# Patient Record
Sex: Male | Born: 1960 | Race: White | Hispanic: No | Marital: Married | Smoking: Former smoker
Health system: Southern US, Community
[De-identification: ages and names within clinical notes are randomized; demographics above are authoritative.]

## PROBLEM LIST (undated history)

## (undated) DIAGNOSIS — M1990 Unspecified osteoarthritis, unspecified site: Secondary | ICD-10-CM

## (undated) DIAGNOSIS — R7303 Prediabetes: Secondary | ICD-10-CM

## (undated) DIAGNOSIS — I10 Essential (primary) hypertension: Secondary | ICD-10-CM

## (undated) DIAGNOSIS — Z87442 Personal history of urinary calculi: Secondary | ICD-10-CM

## (undated) DIAGNOSIS — N200 Calculus of kidney: Secondary | ICD-10-CM

## (undated) HISTORY — PX: LITHOTRIPSY: SUR834

## (undated) HISTORY — PX: HERNIA REPAIR: SHX51

---

## 2011-04-24 ENCOUNTER — Emergency Department (HOSPITAL_BASED_OUTPATIENT_CLINIC_OR_DEPARTMENT_OTHER)
Admission: EM | Admit: 2011-04-24 | Discharge: 2011-04-24 | Disposition: A | Discharge status: Home / Self Care | Payer: 59 | Attending: Emergency Medicine | Admitting: Emergency Medicine

## 2011-04-24 ENCOUNTER — Emergency Department (INDEPENDENT_AMBULATORY_CARE_PROVIDER_SITE_OTHER): Payer: 59

## 2011-04-24 ENCOUNTER — Encounter (HOSPITAL_BASED_OUTPATIENT_CLINIC_OR_DEPARTMENT_OTHER): Payer: Self-pay | Admitting: *Deleted

## 2011-04-24 DIAGNOSIS — R109 Unspecified abdominal pain: Secondary | ICD-10-CM

## 2011-04-24 DIAGNOSIS — R319 Hematuria, unspecified: Secondary | ICD-10-CM

## 2011-04-24 DIAGNOSIS — I10 Essential (primary) hypertension: Secondary | ICD-10-CM | POA: Insufficient documentation

## 2011-04-24 DIAGNOSIS — R911 Solitary pulmonary nodule: Secondary | ICD-10-CM

## 2011-04-24 DIAGNOSIS — N200 Calculus of kidney: Secondary | ICD-10-CM

## 2011-04-24 DIAGNOSIS — K8020 Calculus of gallbladder without cholecystitis without obstruction: Secondary | ICD-10-CM | POA: Insufficient documentation

## 2011-04-24 DIAGNOSIS — N201 Calculus of ureter: Secondary | ICD-10-CM

## 2011-04-24 HISTORY — DX: Essential (primary) hypertension: I10

## 2011-04-24 HISTORY — DX: Calculus of kidney: N20.0

## 2011-04-24 LAB — URINE MICROSCOPIC-ADD ON

## 2011-04-24 LAB — CBC
HCT: 40.9 % (ref 39.0–52.0)
Hemoglobin: 14.8 g/dL (ref 13.0–17.0)
RBC: 4.91 MIL/uL (ref 4.22–5.81)
WBC: 16.9 10*3/uL — ABNORMAL HIGH (ref 4.0–10.5)

## 2011-04-24 LAB — DIFFERENTIAL
Basophils Relative: 0 % (ref 0–1)
Lymphocytes Relative: 6 % — ABNORMAL LOW (ref 12–46)
Monocytes Absolute: 0.8 10*3/uL (ref 0.1–1.0)
Monocytes Relative: 5 % (ref 3–12)
Neutro Abs: 15.1 10*3/uL — ABNORMAL HIGH (ref 1.7–7.7)
Neutrophils Relative %: 90 % — ABNORMAL HIGH (ref 43–77)

## 2011-04-24 LAB — URINALYSIS, ROUTINE W REFLEX MICROSCOPIC
Glucose, UA: 500 mg/dL — AB
Leukocytes, UA: NEGATIVE
Nitrite: NEGATIVE
Specific Gravity, Urine: 1.027 (ref 1.005–1.030)
pH: 6 (ref 5.0–8.0)

## 2011-04-24 LAB — BASIC METABOLIC PANEL
BUN: 24 mg/dL — ABNORMAL HIGH (ref 6–23)
CO2: 26 mEq/L (ref 19–32)
Chloride: 98 mEq/L (ref 96–112)
Creatinine, Ser: 1.2 mg/dL (ref 0.50–1.35)
GFR calc Af Amer: 80 mL/min — ABNORMAL LOW (ref >90)
Potassium: 5 mEq/L (ref 3.5–5.1)

## 2011-04-24 MED ORDER — HYDROMORPHONE HCL PF 1 MG/ML IJ SOLN
1.0000 mg | Freq: Once | INTRAMUSCULAR | Status: AC
  Administered 2011-04-24: 1 mg via INTRAVENOUS
  Filled 2011-04-24: qty 1

## 2011-04-24 MED ORDER — CIPROFLOXACIN IN D5W 400 MG/200ML IV SOLN
400.0000 mg | Freq: Once | INTRAVENOUS | Status: AC
  Administered 2011-04-24: 400 mg via INTRAVENOUS
  Filled 2011-04-24: qty 200

## 2011-04-24 MED ORDER — CIPROFLOXACIN HCL 500 MG PO TABS: 500.0000 mg | ORAL_TABLET | Freq: Two times a day (BID) | ORAL | Status: AC

## 2011-04-24 MED ORDER — SODIUM CHLORIDE 0.9 % IV BOLUS (SEPSIS)
1000.0000 mL | Freq: Once | INTRAVENOUS | Status: AC
  Administered 2011-04-24: 1000 mL via INTRAVENOUS

## 2011-04-24 MED ORDER — TAMSULOSIN HCL 0.4 MG PO CAPS: 0.4000 mg | ORAL_CAPSULE | Freq: Every day | ORAL | Status: AC

## 2011-04-24 MED ORDER — ONDANSETRON HCL 4 MG/2ML IJ SOLN
4.0000 mg | Freq: Once | INTRAMUSCULAR | Status: AC
  Administered 2011-04-24: 4 mg via INTRAVENOUS
  Filled 2011-04-24: qty 2

## 2011-04-24 MED ORDER — KETOROLAC TROMETHAMINE 30 MG/ML IJ SOLN
30.0000 mg | Freq: Once | INTRAMUSCULAR | Status: AC
  Administered 2011-04-24: 30 mg via INTRAVENOUS
  Filled 2011-04-24: qty 1

## 2011-04-24 MED ORDER — HYDROCODONE-ACETAMINOPHEN 5-325 MG PO TABS: 1.0000 | ORAL_TABLET | Freq: Four times a day (QID) | ORAL | Status: AC | PRN

## 2011-04-24 NOTE — ED Provider Notes (Signed)
History     CSN: [id number]  Arrival date & time 04/24/11  1247   First MD Initiated Contact with Patient 04/24/11 1408      Chief Complaint  Patient presents with  . Flank Pain    (Consider location/radiation/quality/duration/timing/severity/associated sxs/prior treatment) HPI This male with history of multiple kidney stones presents with the acute onset of left sided flank pain.  The pain awoke him from sleeping approximately 12 hours ago.  Since onset the pain has been persistent.  The pain is sharp, crampy with radiation towards his left inguinal region.  The patient also complains of nausea, vomiting.  No significant relief with Tylenol.   Past Medical History  Diagnosis Date  . Kidney stones   . Hypertension     Past Surgical History  Procedure Date  . Hernia repair   . Lithotripsy     No family history on file.  History  Substance Use Topics  . Smoking status: Former Games developer  . Smokeless tobacco: Never Used  . Alcohol Use: No      Review of Systems  Constitutional:       Per HPI, otherwise negative  HENT:       Per HPI, otherwise negative  Eyes: Negative.   Respiratory:       Per HPI, otherwise negative  Cardiovascular:       Per HPI, otherwise negative  Gastrointestinal: Positive for nausea and vomiting.  Genitourinary: Positive for flank pain.  Musculoskeletal:       Per HPI, otherwise negative  Skin: Negative.   Neurological: Negative for syncope.    Allergies  Review of patient's allergies indicates no known allergies.  Home Medications   Current Outpatient Rx  Name Route Sig Dispense Refill  . ASPIRIN 81 MG PO TABS Oral Take 81 mg by mouth daily.    . OMEGA-3 FATTY ACIDS 1000 MG PO CAPS Oral Take 2 g by mouth daily.    . OXYCODONE HCL PO Oral Take by mouth. Old rx from 2011    . QUINAPRIL HCL 40 MG PO TABS Oral Take 40 mg by mouth at bedtime.    . TRAMADOL HCL 50 MG PO TABS Oral Take 50 mg by mouth every 6 (six) hours as needed.       BP 144/90  Pulse 88  Temp(Src) 98.3 F (36.8 C) (Oral)  Resp 18  Ht 6' (1.829 m)  Wt 235 lb (106.595 kg)  BMI 31.87 kg/m2  SpO2 98%  Physical Exam  Nursing note and vitals reviewed. Constitutional: He is oriented to person, place, and time. He appears well-developed. No distress.  HENT:  Head: Normocephalic and atraumatic.  Eyes: Conjunctivae and EOM are normal.  Cardiovascular: Normal rate and regular rhythm.   Pulmonary/Chest: Effort normal. No stridor. No respiratory distress.  Abdominal: He exhibits no distension. There is tenderness. There is CVA tenderness. There is no rigidity, no rebound and no guarding.  Musculoskeletal: He exhibits no edema.  Neurological: He is alert and oriented to person, place, and time.  Skin: Skin is warm and dry.  Psychiatric: He has a normal mood and affect.    ED Course  Procedures (including critical care time)  Labs Reviewed  URINALYSIS, ROUTINE W REFLEX MICROSCOPIC - Abnormal; Notable for the following:    APPearance CLOUDY (*)    Glucose, UA 500 (*)    Hgb urine dipstick LARGE (*)    Ketones, ur 15 (*)    Protein, ur 30 (*)    All  other components within normal limits  CBC - Abnormal; Notable for the following:    WBC 16.9 (*)    MCHC 36.2 (*)    All other components within normal limits  DIFFERENTIAL - Abnormal; Notable for the following:    Neutrophils Relative 90 (*)    Neutro Abs 15.1 (*)    Lymphocytes Relative 6 (*)    All other components within normal limits  BASIC METABOLIC PANEL - Abnormal; Notable for the following:    Sodium 134 (*)    Glucose, Bld 143 (*)    BUN 24 (*)    GFR calc non Af Amer 69 (*)    GFR calc Af Amer 80 (*)    All other components within normal limits  URINE MICROSCOPIC-ADD ON - Abnormal; Notable for the following:    Bacteria, UA MANY (*)    All other components within normal limits   Ct Abdomen Pelvis Wo Contrast  04/24/2011  *RADIOLOGY REPORT*  Clinical Data: Left flank pain and  hematuria.  CT ABDOMEN AND PELVIS WITHOUT CONTRAST  Technique:  Multidetector CT imaging of the abdomen and pelvis was performed following the standard protocol without intravenous contrast.  Comparison: Abdominal radiograph 04/24/2011.  Findings: Lung bases show minimal dependent atelectasis.  A 3 mm subpleural left lower lobe nodule is nonspecific.  Heart size normal.  No pericardial or pleural effusion.  Liver is unremarkable.  A stone is seen in the gallbladder. Adrenal glands are unremarkable.  A punctate stone is seen in the interpolar right kidney.  There is extensive left perinephric edema with minimal left hydronephrosis, secondary to a 5 mm mid left ureteral stone. Remainder of the left ureter is decompressed.  Spleen, pancreas, stomach and bowel are unremarkable.  No pathologically enlarged lymph nodes.  No free fluid.  Circumaortic left renal vein is incidentally noted.  No worrisome lytic or sclerotic lesions.  Degenerative changes are seen in the spine.  IMPRESSION:  1.  Mildly obstructing mid left ureteral stone, as on comparison radiographs. 2.  Tiny subpleural left lower lobe nodule is nonspecific. If the patient is at high risk for bronchogenic carcinoma, follow-up chest CT at 1 year is recommended.  If the patient is at low risk, no follow-up is needed.  This recommendation follows the consensus statement: Guidelines for Management of Small Pulmonary Nodules Detected on CT Scans:  A Statement from the Fleischner Society as published in Radiology 2005; 237:395-400. 3.  Cholelithiasis. 4.  Punctate right renal stone.  Original Report Authenticated By: Reyes Ivan, M.D.   Dg Abd 1 View  04/24/2011  *RADIOLOGY REPORT*  Clinical Data: Left flank pain and hematuria.  ABDOMEN - 1 VIEW  Comparison: None.  Findings: A 5 mm calcific density projects over the left L3 transverse process.  No additional radiopaque calculi project over the expected course of the ureters.  The right renal outline is  obscured by stool in the colon.  IMPRESSION:  1.  Possible mid left ureteral stone. 2.  Suspect constipation.  Original Report Authenticated By: Reyes Ivan, M.D.   XR reviewed by me  No diagnosis found.    MDM  This 51 year old male presents with left flank pain.  On exam the patient is uncomfortable appearing.  Following IV fluids, analgesics the patient is significantly improved.  The patient's labs and radiographic studies are consistent with kidney stone, with some concern for infection.  There is no evidence of significant obstruction.  The patient was discharged in stable condition  home with the urologist follow-up.   Gerhard Munch, MD 04/24/11 [phone]

## 2011-04-24 NOTE — ED Notes (Signed)
C/o left side flank pain since 0115 this morning- hx of kidney stones

## 2013-04-07 IMAGING — CR DG ABDOMEN 1V
2 series · 2 of 2 positions shown · non-contrast
Comparison: None.

CLINICAL DATA: Left flank pain and hematuria.

ABDOMEN - 1 VIEW

[t abdomen supine (1 of 2)]
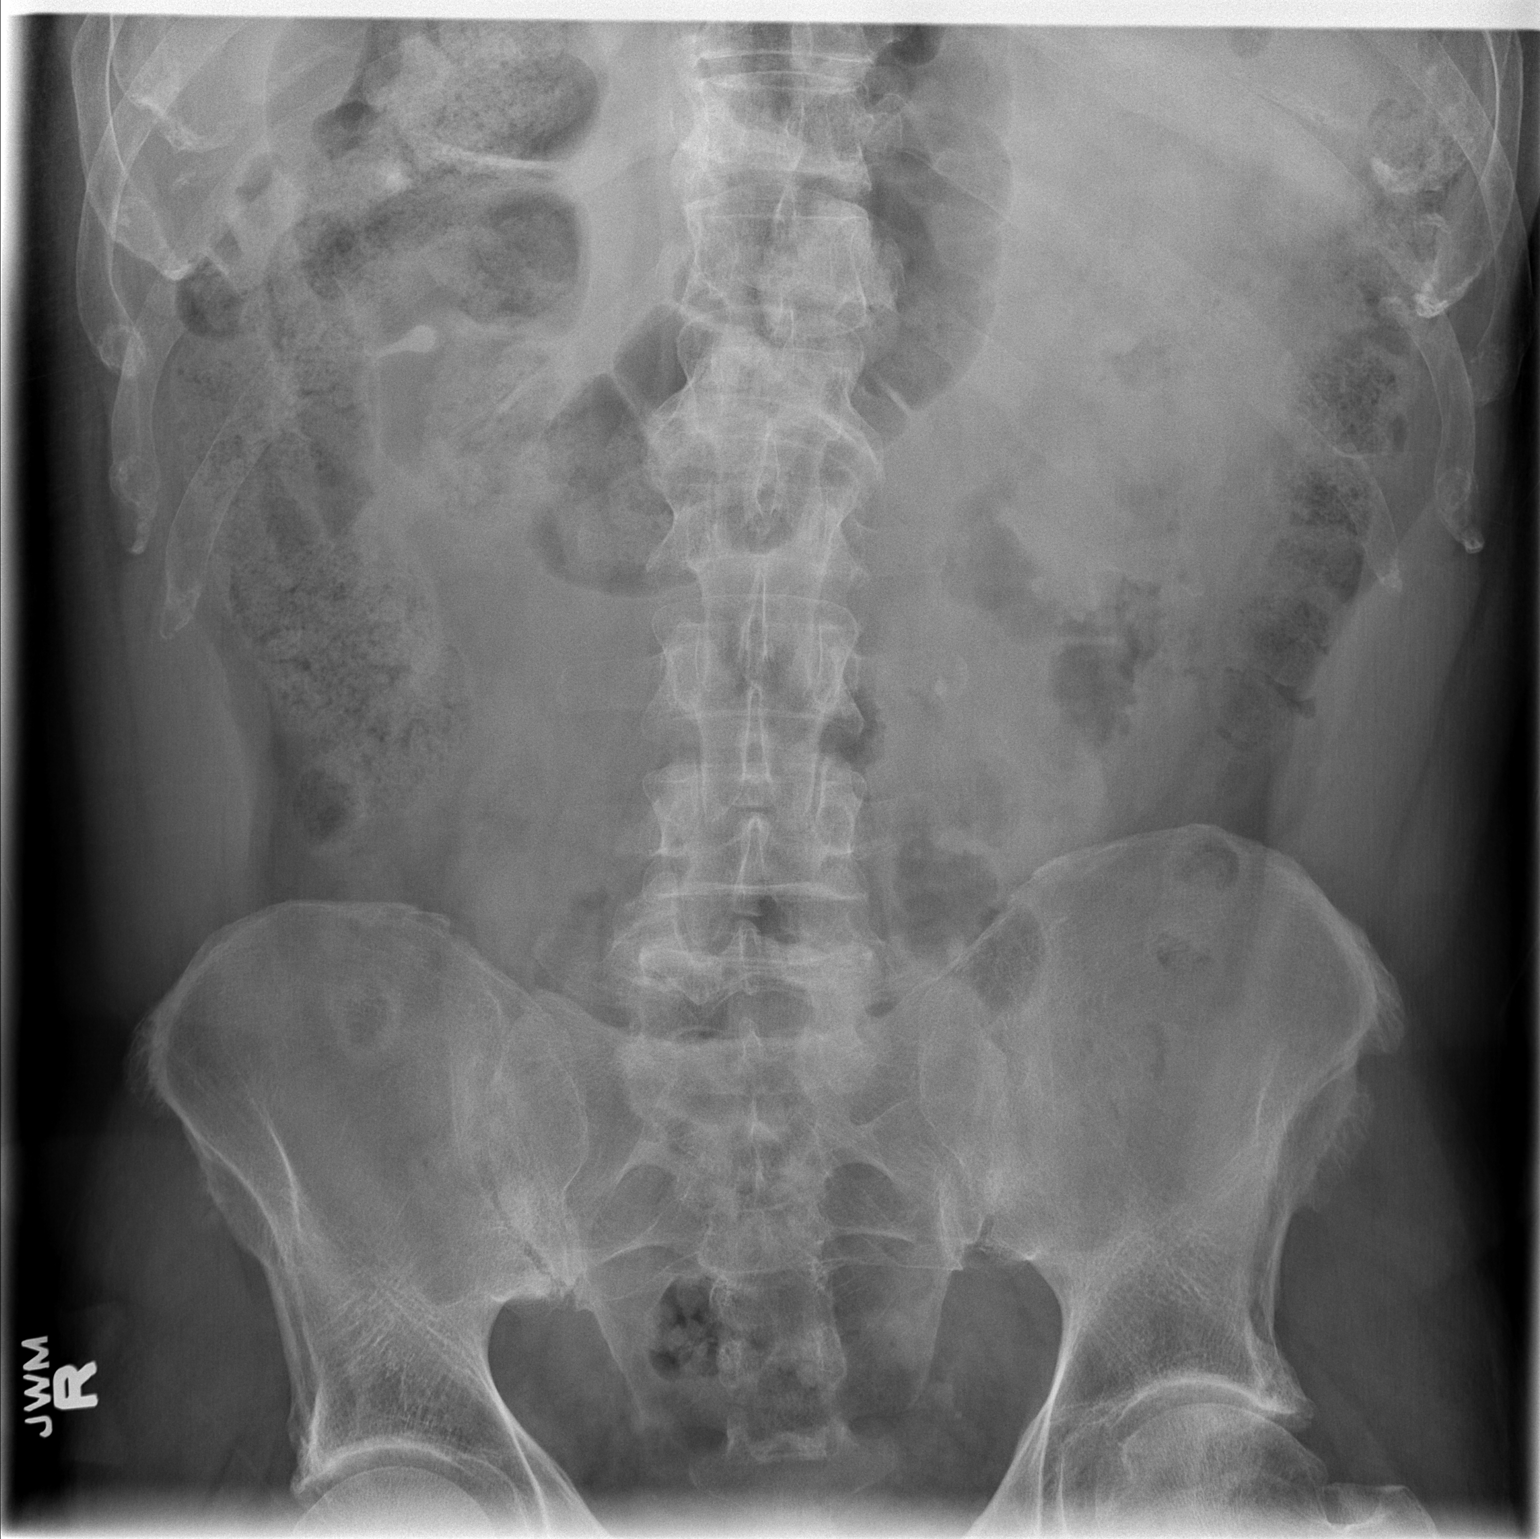

[t abdomen supine (2 of 2)]
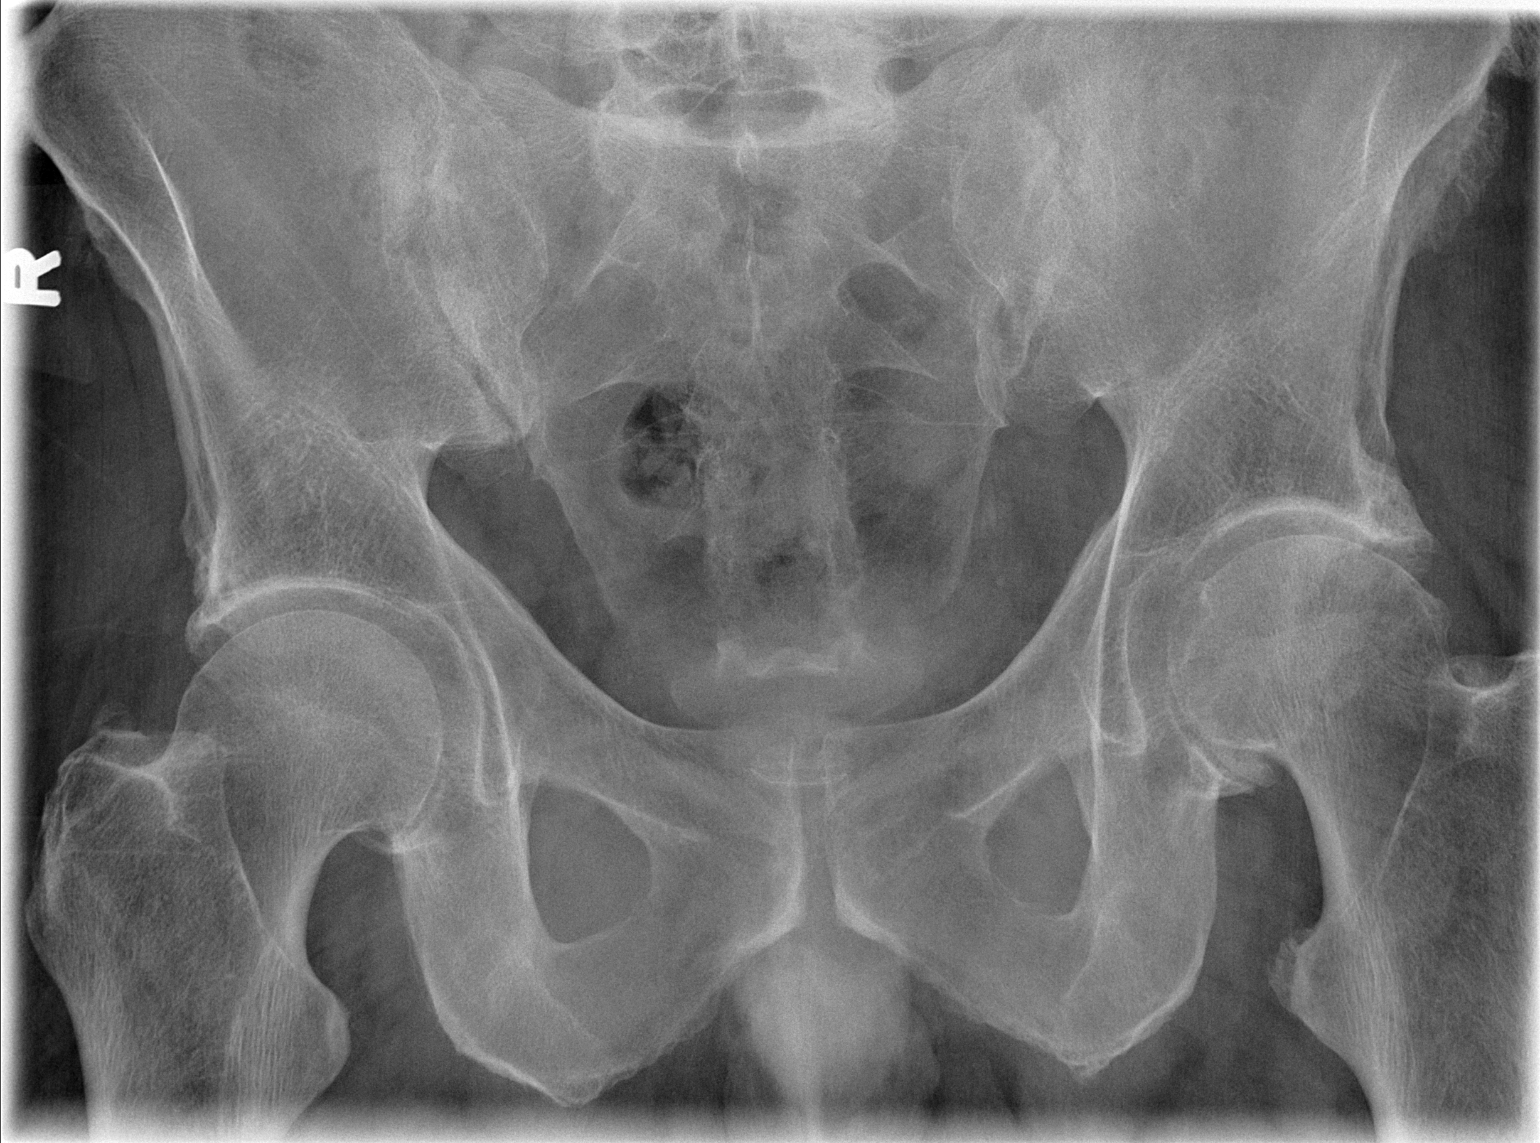

[2 of 2 positions shown; findings below may reference images not displayed]

FINDINGS: A 5 mm calcific density projects over the left L3
transverse process.  No additional radiopaque calculi project over
the expected course of the ureters.  The right renal outline is
obscured by stool in the colon.
IMPRESSION: 1.  Possible mid left ureteral stone.
2.  Suspect constipation.

## 2013-04-07 IMAGING — CT CT ABD-PELV W/O CM
2 of 4 series · 16 of 46 positions shown, 18 images · non-contrast
Comparison: Abdominal radiograph 04/24/2011.

CLINICAL DATA: Left flank pain and hematuria.

CT ABDOMEN AND PELVIS WITHOUT CONTRAST
TECHNIQUE: Multidetector CT imaging of the abdomen and pelvis was
performed following the standard protocol without intravenous
contrast.

[Series 2: renal stone > 200 lbs 5.0 b31f · axial · 0.79mm/px · z∈[+949,+1434]mm · 13 of 107 slices shown, 15 images]
[im 5/107  soft-tissue]
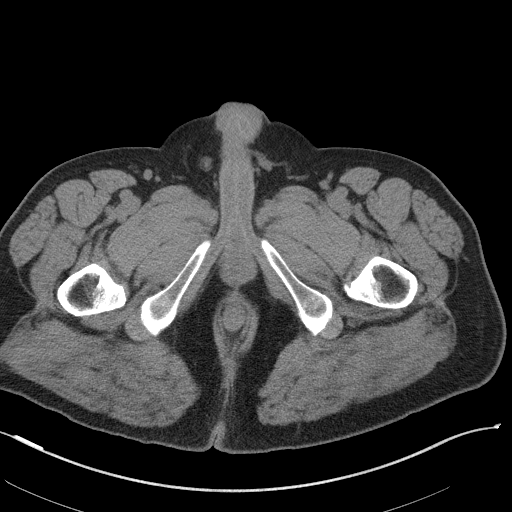
[im 5/107  bone]
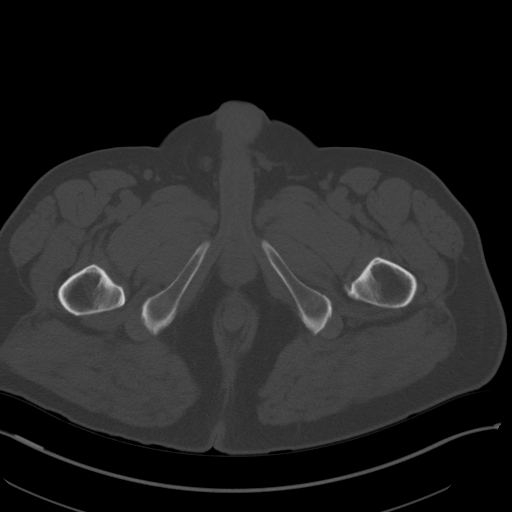
[im 14/107  soft-tissue]
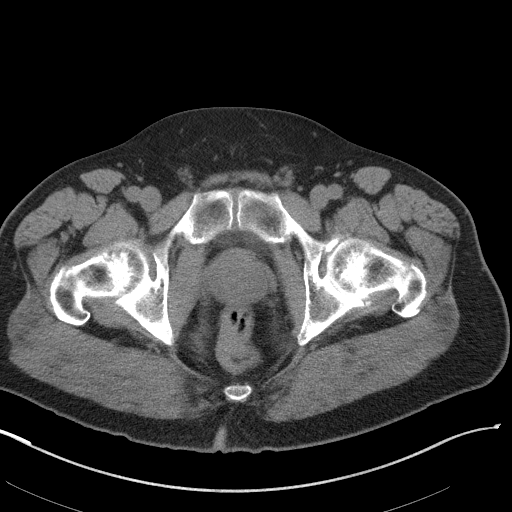
[im 24/107  soft-tissue]
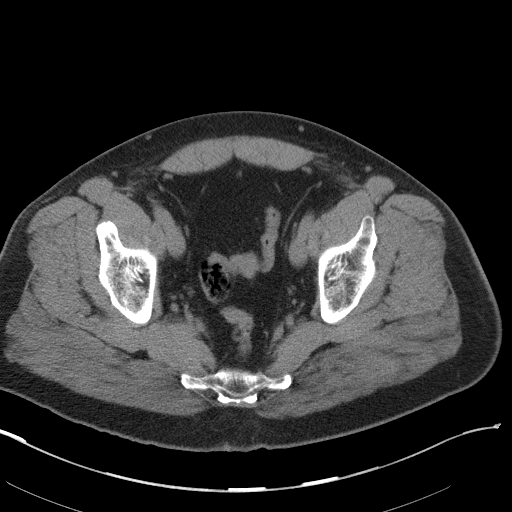
[im 28/107  soft-tissue]
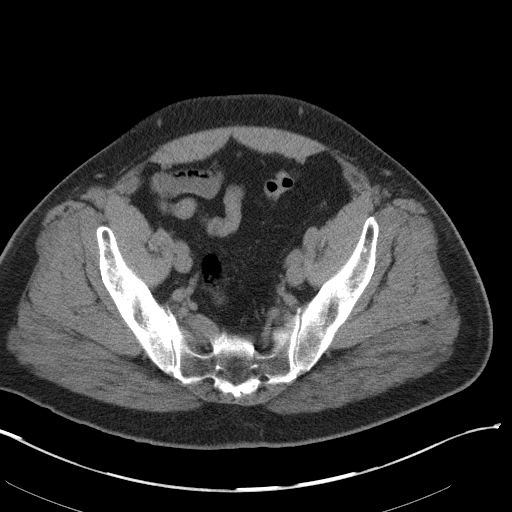
[im 37/107  soft-tissue]
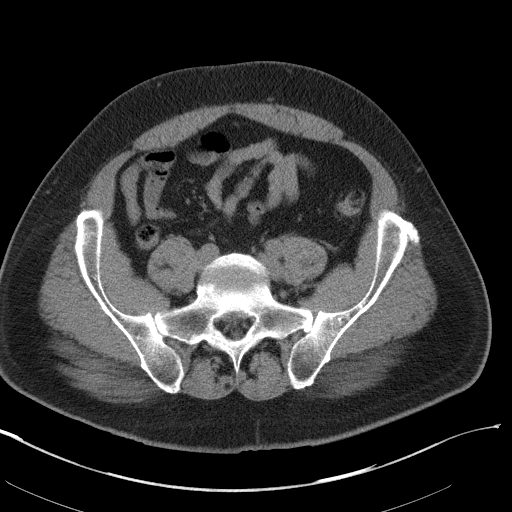
[im 47/107  soft-tissue]
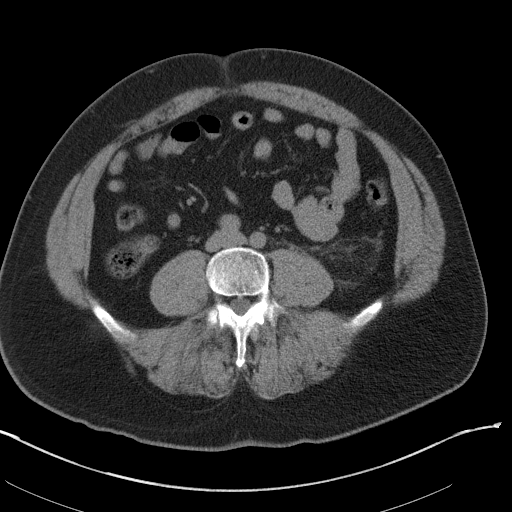
[im 56/107  soft-tissue]
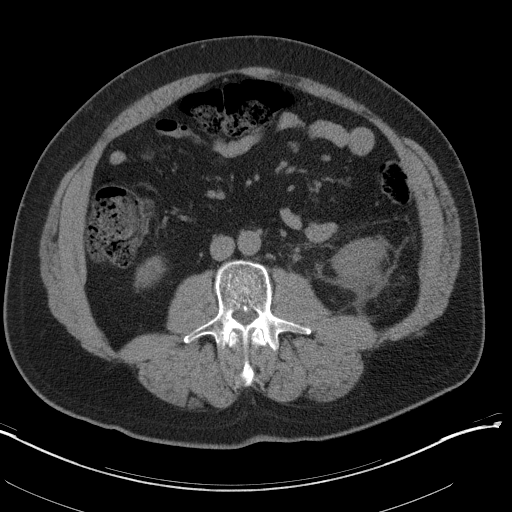
[im 60/107  soft-tissue]
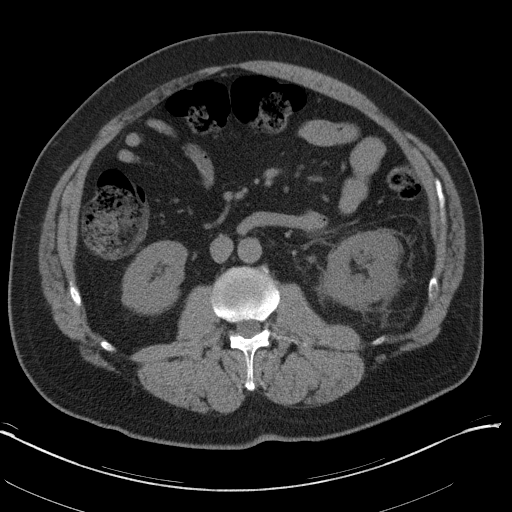
[im 70/107  soft-tissue]
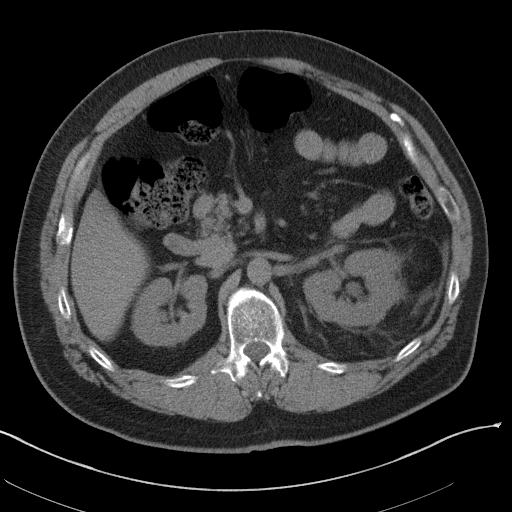
[im 70/107  bone]
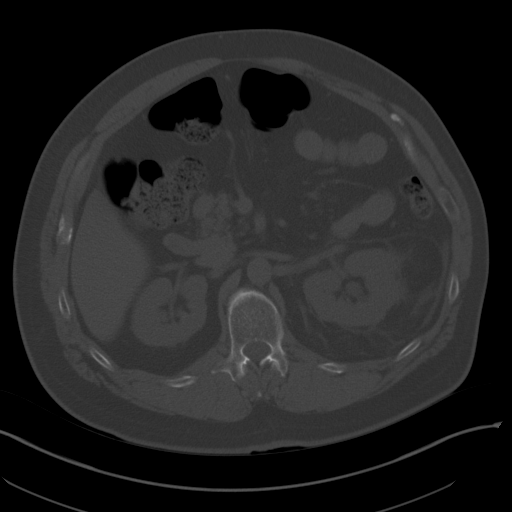
[im 79/107  soft-tissue]
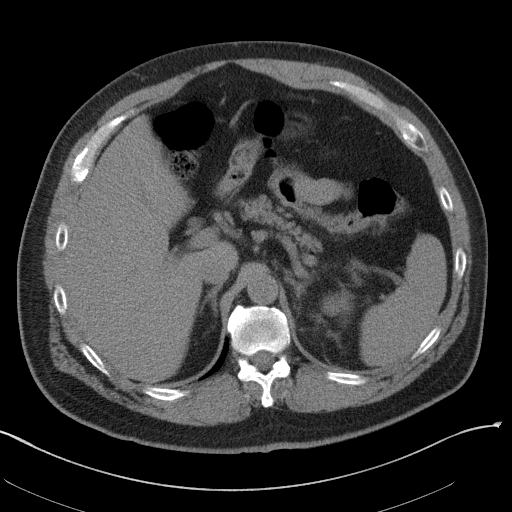
[im 83/107  soft-tissue]
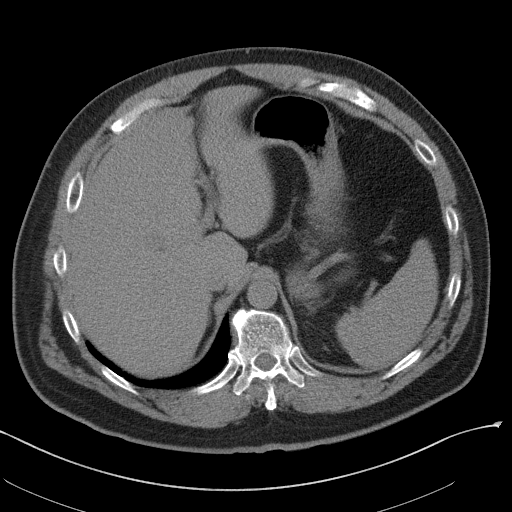
[im 93/107  soft-tissue]
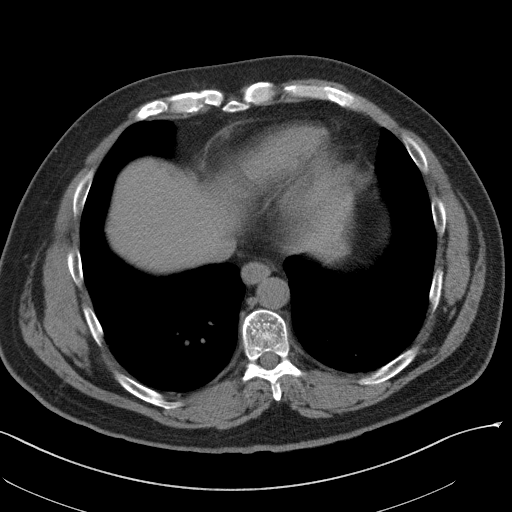
[im 102/107  soft-tissue]
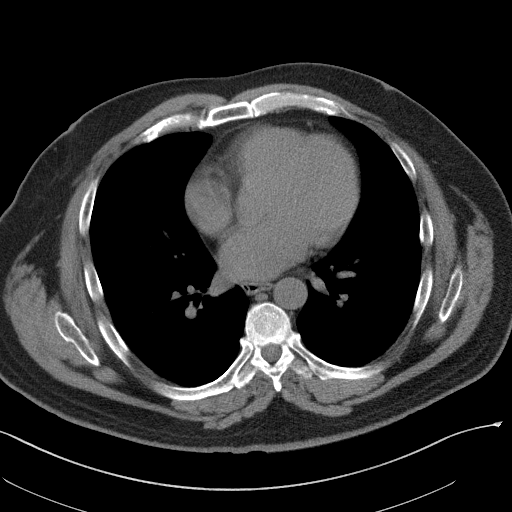

[Series 5: renal stone 3.0 coronal · coronal · 0.84mm/px · 3 of 111 slices shown]
[im 37/111  soft-tissue]
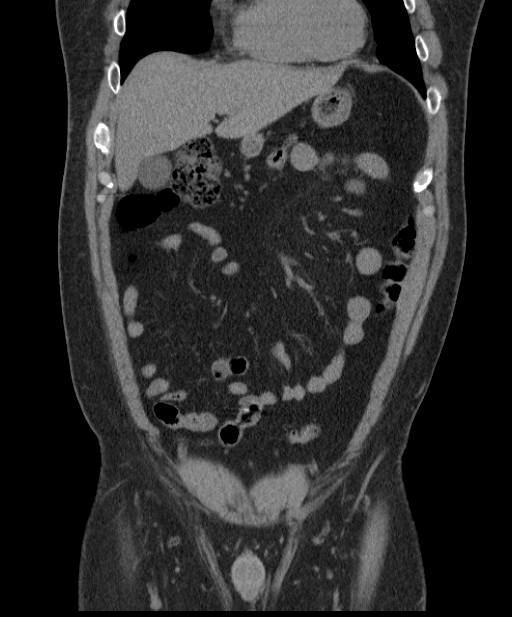
[im 49/111  soft-tissue]
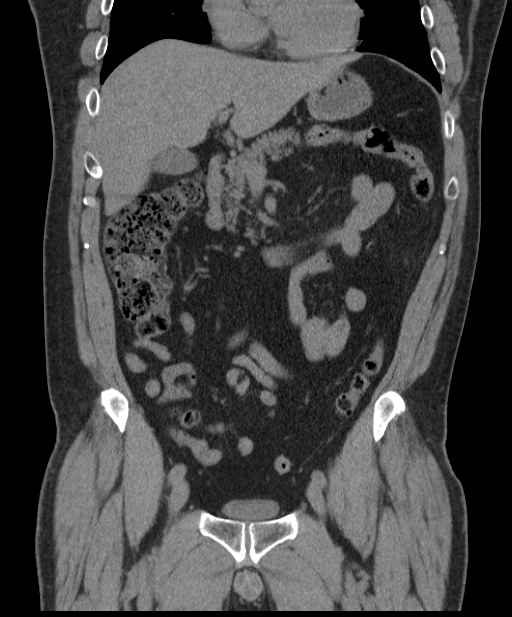
[im 62/111  soft-tissue]
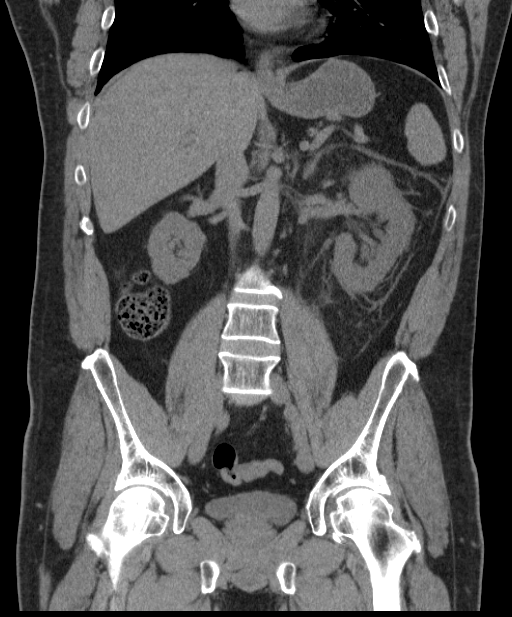

[16 of 46 positions shown; findings below may reference images not displayed]

FINDINGS: Lung bases show minimal dependent atelectasis.  A 3 mm
subpleural left lower lobe nodule is nonspecific.  Heart size
normal.  No pericardial or pleural effusion.

Liver is unremarkable.  A stone is seen in the gallbladder.
Adrenal glands are unremarkable.  A punctate stone is seen in the
interpolar right kidney.

There is extensive left perinephric edema with minimal left
hydronephrosis, secondary to a 5 mm mid left ureteral stone.
Remainder of the left ureter is decompressed.

Spleen, pancreas, stomach and bowel are unremarkable.  No
pathologically enlarged lymph nodes.  No free fluid.  Circumaortic
left renal vein is incidentally noted.  No worrisome lytic or
sclerotic lesions.  Degenerative changes are seen in the spine.
IMPRESSION: 1.  Mildly obstructing mid left ureteral stone, as on comparison
radiographs.
2.  Tiny subpleural left lower lobe nodule is nonspecific. If the
patient is at high risk for bronchogenic carcinoma, follow-up chest
CT at 1 year is recommended.  If the patient is at low risk, no
follow-up is needed.  This recommendation follows the consensus
statement: Guidelines for Management of Small Pulmonary Nodules
Detected on CT Scans:  A Statement from the [HOSPITAL] as
published in Radiology 6660; [DATE].
3.  Cholelithiasis.
4.  Punctate right renal stone.

## 2017-03-18 ENCOUNTER — Other Ambulatory Visit: Payer: Self-pay | Admitting: Urology

## 2017-03-19 ENCOUNTER — Encounter (HOSPITAL_COMMUNITY): Payer: Self-pay | Admitting: General Practice

## 2017-03-20 NOTE — Progress Notes (Signed)
Please sign lithotripsy orders; Pt is scheduled for procedure on Monday 03/23/2017. Thanks.

## 2017-03-23 ENCOUNTER — Encounter (HOSPITAL_COMMUNITY)
Admission: RE | Disposition: A | Discharge status: Home / Self Care | Payer: Self-pay | Source: Ambulatory Visit | Attending: Urology

## 2017-03-23 ENCOUNTER — Ambulatory Visit (HOSPITAL_COMMUNITY)
Admission: RE | Admit: 2017-03-23 | Discharge: 2017-03-23 | Disposition: A | Discharge status: Home / Self Care | Payer: BLUE CROSS/BLUE SHIELD | Source: Ambulatory Visit | Attending: Urology | Admitting: Urology

## 2017-03-23 ENCOUNTER — Encounter (HOSPITAL_COMMUNITY): Payer: Self-pay | Admitting: *Deleted

## 2017-03-23 ENCOUNTER — Ambulatory Visit (HOSPITAL_COMMUNITY): Payer: BLUE CROSS/BLUE SHIELD

## 2017-03-23 ENCOUNTER — Other Ambulatory Visit: Payer: Self-pay

## 2017-03-23 DIAGNOSIS — Z87891 Personal history of nicotine dependence: Secondary | ICD-10-CM | POA: Insufficient documentation

## 2017-03-23 DIAGNOSIS — I10 Essential (primary) hypertension: Secondary | ICD-10-CM | POA: Diagnosis not present

## 2017-03-23 DIAGNOSIS — N201 Calculus of ureter: Secondary | ICD-10-CM | POA: Diagnosis not present

## 2017-03-23 HISTORY — PX: EXTRACORPOREAL SHOCK WAVE LITHOTRIPSY: SHX1557

## 2017-03-23 HISTORY — DX: Personal history of urinary calculi: Z87.442

## 2017-03-23 SURGERY — LITHOTRIPSY, ESWL
Anesthesia: LOCAL | Laterality: Right

## 2017-03-23 MED ORDER — SODIUM CHLORIDE 0.9 % IV SOLN
INTRAVENOUS | Status: DC
  Administered 2017-03-23: 10:00:00 via INTRAVENOUS

## 2017-03-23 MED ORDER — CIPROFLOXACIN HCL 500 MG PO TABS
500.0000 mg | ORAL_TABLET | ORAL | Status: AC
  Administered 2017-03-23: 500 mg via ORAL
  Filled 2017-03-23: qty 1

## 2017-03-23 MED ORDER — DIPHENHYDRAMINE HCL 25 MG PO CAPS
25.0000 mg | ORAL_CAPSULE | ORAL | Status: AC
  Administered 2017-03-23: 25 mg via ORAL
  Filled 2017-03-23: qty 1

## 2017-03-23 MED ORDER — DIAZEPAM 5 MG PO TABS
10.0000 mg | ORAL_TABLET | ORAL | Status: AC
  Administered 2017-03-23: 10 mg via ORAL
  Filled 2017-03-23: qty 2

## 2017-03-23 SURGICAL SUPPLY — 2 items
COVER SURGICAL LIGHT HANDLE (MISCELLANEOUS) ×2 IMPLANT
TOWEL OR 17X26 10 PK STRL BLUE (TOWEL DISPOSABLE) ×2 IMPLANT

## 2017-03-23 NOTE — Op Note (Signed)
See [facility] OP note scanned into chart. 

## 2017-03-23 NOTE — H&P (Signed)
H&P  Chief Complaint: Right sided kidney stone  History of Present Illness: 57 year old male presents for ESL. He has an intermittently symptomatic, non progressing right distal ureteral stone. We have explained treatment options with him--continued MET vs ureteroscopy vs ESL. He has chosen the latter. He is aware of risks and complications and desires to proceed.  Past Medical History:  Diagnosis Date  . History of kidney stones   . Hypertension   . Kidney stones     Past Surgical History:  Procedure Laterality Date  . HERNIA REPAIR     done as child  . LITHOTRIPSY      Home Medications:  Allergies as of 03/23/2017   No Known Allergies     Medication List    Notice   Cannot display discharge medications because the patient has not yet been admitted.     Allergies: No Known Allergies  History reviewed. No pertinent family history.  Social History:  reports that he has quit smoking. he has never used smokeless tobacco. He reports that he does not drink alcohol or use drugs.  ROS: A complete review of systems was performed.  All systems are negative except for pertinent findings as noted.  Physical Exam:  Vital signs in last 24 hours:   Constitutional:  Alert and oriented, No acute distress Cardiovascular: Regular rate and rhythm, No JVD Respiratory: Normal respiratory effort, Lungs clear bilaterally GI: Abdomen is soft, nontender, nondistended, no abdominal masses Lymphatic: No lymphadenopathy Neurologic: Grossly intact, no focal deficits Psychiatric: Normal mood and affect  Laboratory Data:  No results for input(s): WBC, HGB, HCT, PLT in the last 72 hours.  No results for input(s): NA, K, CL, GLUCOSE, BUN, CALCIUM, CREATININE in the last 72 hours.  Invalid input(s): CO3   No results found for this or any previous visit (from the past 24 hour(s)). No results found for this or any previous visit (from the past 240 hour(s)).  Renal Function: No results for  input(s): CREATININE in the last 168 hours. CrCl cannot be calculated (Patient's most recent lab result is older than the maximum 21 days allowed.).  Radiologic Imaging: No results found.  Impression/Assessment:  6 mm right distal ureteral stone  Plan:  ESL

## 2017-03-23 NOTE — Discharge Instructions (Signed)
Moderate Conscious Sedation, Adult, Care After °These instructions provide you with information about caring for yourself after your procedure. Your health care provider may also give you more specific instructions. Your treatment has been planned according to current medical practices, but problems sometimes occur. Call your health care provider if you have any problems or questions after your procedure. °What can I expect after the procedure? °After your procedure, it is common: °· To feel sleepy for several hours. °· To feel clumsy and have poor balance for several hours. °· To have poor judgment for several hours. °· To vomit if you eat too soon. ° °Follow these instructions at home: °For at least 24 hours after the procedure: ° °· Do not: °? Participate in activities where you could fall or become injured. °? Drive. °? Use heavy machinery. °? Drink alcohol. °? Take sleeping pills or medicines that cause drowsiness. °? Make important decisions or sign legal documents. °? Take care of children on your own. °· Rest. °Eating and drinking °· Follow the diet recommended by your health care provider. °· If you vomit: °? Drink water, juice, or soup when you can drink without vomiting. °? Make sure you have little or no nausea before eating solid foods. °General instructions °· Have a responsible adult stay with you until you are awake and alert. °· Take over-the-counter and prescription medicines only as told by your health care provider. °· If you smoke, do not smoke without supervision. °· Keep all follow-up visits as told by your health care provider. This is important. °Contact a health care provider if: °· You keep feeling nauseous or you keep vomiting. °· You feel light-headed. °· You develop a rash. °· You have a fever. °Get help right away if: °· You have trouble breathing. °This information is not intended to replace advice given to you by your health care provider. Make sure you discuss any questions you have  with your health care provider. °Document Released: 11/10/2012 Document Revised: 06/25/2015 Document Reviewed: 05/12/2015 °Elsevier Interactive Patient Education © 2018 [employer] °Lithotripsy, Care After °This sheet gives you information about how to care for yourself after your procedure. Your health care provider may also give you more specific instructions. If you have problems or questions, contact your health care provider. °What can I expect after the procedure? °After the procedure, it is common to have: °· Some blood in your urine. This should only last for a few days. °· Soreness in your back, sides, or upper abdomen for a few days. °· Blotches or bruises on your back where the pressure wave entered the skin. °· Pain, discomfort, or nausea when pieces (fragments) of the kidney stone move through the tube that carries urine from the kidney to the bladder (ureter). Stone fragments may pass soon after the procedure, but they may continue to pass for up to 4-8 weeks. °? If you have severe pain or nausea, contact your health care provider. This may be caused by a large stone that was not broken up, and this may mean that you need more treatment. °· Some pain or discomfort during urination. °· Some pain or discomfort in the lower abdomen or (in men) at the base of the penis. ° °Follow these instructions at home: °Medicines °· Take over-the-counter and prescription medicines only as told by your health care provider. °· If you were prescribed an antibiotic medicine, take it as told by your health care provider. Do not stop taking the antibiotic even if you   start to feel better. °· Do not drive for 24 hours if you were given a medicine to help you relax (sedative). °· Do not drive or use heavy machinery while taking prescription pain medicine. °Eating and drinking °· Drink enough water and fluids to keep your urine clear or pale yellow. This helps any remaining pieces of the stone to pass. It can also help  prevent new stones from forming. °· Eat plenty of fresh fruits and vegetables. °· Follow instructions from your health care provider about eating and drinking restrictions. You may be instructed: °? To reduce how much salt (sodium) you eat or drink. Check ingredients and nutrition facts on packaged foods and beverages. °? To reduce how much meat you eat. °· Eat the recommended amount of calcium for your age and gender. Ask your health care provider how much calcium you should have. °General instructions °· Get plenty of rest. °· Most people can resume normal activities 1-2 days after the procedure. Ask your health care provider what activities are safe for you. °· If directed, strain all urine through the strainer that was provided by your health care provider. °? Keep all fragments for your health care provider to see. Any stones that are found may be sent to a medical lab for examination. The stone may be as small as a grain of salt. °· Keep all follow-up visits as told by your health care provider. This is important. °Contact a health care provider if: °· You have pain that is severe or does not get better with medicine. °· You have nausea that is severe or does not go away. °· You have blood in your urine longer than your health care provider told you to expect. °· You have more blood in your urine. °· You have pain during urination that does not go away. °· You urinate more frequently than usual and this does not go away. °· You develop a rash or any other possible signs of an allergic reaction. °Get help right away if: °· You have severe pain in your back, sides, or upper abdomen. °· You have severe pain while urinating. °· Your urine is very dark red. °· You have blood in your stool (feces). °· You cannot pass any urine at all. °· You feel a strong urge to urinate after emptying your bladder. °· You have a fever or chills. °· You develop shortness of breath, difficulty breathing, or chest pain. °· You have  severe nausea that leads to persistent vomiting. °· You faint. °Summary °· After this procedure, it is common to have some pain, discomfort, or nausea when pieces (fragments) of the kidney stone move through the tube that carries urine from the kidney to the bladder (ureter). If this pain or nausea is severe, however, you should contact your health care provider. °· Most people can resume normal activities 1-2 days after the procedure. Ask your health care provider what activities are safe for you. °· Drink enough water and fluids to keep your urine clear or pale yellow. This helps any remaining pieces of the stone to pass, and it can help prevent new stones from forming. °· If directed, strain your urine and keep all fragments for your health care provider to see. Fragments or stones may be as small as a grain of salt. °· Get help right away if you have severe pain in your back, sides, or upper abdomen or have severe pain while urinating. °This information is not intended to replace advice   given to you by your health care provider. Make sure you discuss any questions you have with your health care provider. °Document Released: 02/09/2007 Document Revised: 12/12/2015 Document Reviewed: 12/12/2015 °Elsevier Interactive Patient Education © 2018 Elsevier Inc. ° °See [facility] discharge instructions in chart. °

## 2017-03-24 ENCOUNTER — Encounter (HOSPITAL_COMMUNITY): Payer: Self-pay | Admitting: Urology

## 2019-05-25 ENCOUNTER — Ambulatory Visit: Payer: BLUE CROSS/BLUE SHIELD | Admitting: Orthopaedic Surgery

## 2019-05-25 ENCOUNTER — Ambulatory Visit: Payer: Self-pay

## 2019-05-25 ENCOUNTER — Other Ambulatory Visit: Payer: Self-pay

## 2019-05-25 DIAGNOSIS — M25552 Pain in left hip: Secondary | ICD-10-CM

## 2019-05-25 DIAGNOSIS — M1612 Unilateral primary osteoarthritis, left hip: Secondary | ICD-10-CM | POA: Insufficient documentation

## 2019-05-25 DIAGNOSIS — M25559 Pain in unspecified hip: Secondary | ICD-10-CM

## 2019-05-25 NOTE — Progress Notes (Signed)
Office Visit Note   Patient: Gregory Howard           Date of Birth: 11-Jun-1960           MRN: [id number] Visit Date: 05/25/2019              Requested by: Lawerance Cruel, [place],  [place] [address] PCP: Lawerance Cruel, MD   Assessment & Plan: Visit Diagnoses:  1. Hip pain   2. Unilateral primary osteoarthritis, left hip     Plan: I went over his hip x-rays with him in detail and talked him about the potential for hip replacement surgery in the future.  He would like to try an intra-articular steroid injection in that left hip and I agree with this as well.  We will work on getting him an appointment next week with Dr. Junius Roads to have this done under direct ultrasound with a left hip steroid injection.  All questions and concerns were answered addressed.  Once he has his injection he can certainly follow back up with me in at least a month after that if he would like to.  Follow-Up Instructions: No follow-ups on file.   Orders:  Orders Placed This Encounter  Procedures  . XR HIP UNILAT W OR W/O PELVIS 1V LEFT   No orders of the defined types were placed in this encounter.     Procedures: No procedures performed   Clinical Data: No additional findings.   Subjective: Chief Complaint  Patient presents with  . Left Hip - Pain  Patient is a very pleasant and active 59 year old gentleman has been dealing with left hip pain for many years now.  The last 2 to 3 months though it has been getting worse.  He points to the lateral aspect of his left hip but also the groin and rating down his leg as a source of pain.  He reports decreased motion and strength on that side.  He has more difficulty with putting his shoes and socks on on the left side and crossing his leg.  He denies any other acute changes in his medical status.  He is on his feet quite a bit with the work that he does.  He denies any other significant active medical issues.  He has not injured  that hip before or had surgery on his left hip before.  HPI  Review of Systems He currently denies any headache, chest pain, shortness of breath, fever, chills, nausea, vomiting  Objective: Vital Signs: There were no vitals taken for this visit.  Physical Exam He is alert and orient x3 and in no acute distress Ortho Exam Examination of his right hip is normal examination of his left hip shows significant stiffness with internal and external rotation with pain in the groin as well. Specialty Comments:  No specialty comments available.  Imaging: XR HIP UNILAT W OR W/O PELVIS 1V LEFT  Result Date: 05/25/2019 An AP pelvis and lateral left hip shows quite significant arthritis of left hip.  There is cystic change in the femoral head.  There is flattening of the superior lateral aspect of the femoral head and loss of joint space which is almost completed that area.  There are also large para-articular osteophytes.  The right hip appears to have just minimal arthritic changes with minimal narrowing.    PMFS History: Patient Active Problem List   Diagnosis Date Noted  . Unilateral primary osteoarthritis, left hip 05/25/2019  Past Medical History:  Diagnosis Date  . History of kidney stones   . Hypertension   . Kidney stones     No family history on file.  Past Surgical History:  Procedure Laterality Date  . EXTRACORPOREAL SHOCK WAVE LITHOTRIPSY Right 03/23/2017   Procedure: RIGHT EXTRACORPOREAL SHOCK WAVE LITHOTRIPSY (ESWL);  Surgeon: Marcine Matar, MD;  Location: [facility]S;  Service: Urology;  Laterality: Right;  . HERNIA REPAIR     done as child  . LITHOTRIPSY     Social History   Occupational History  . Not on file  Tobacco Use  . Smoking status: Former Games developer  . Smokeless tobacco: Never Used  Substance and Sexual Activity  . Alcohol use: No  . Drug use: No  . Sexual activity: Not on file

## 2019-06-06 ENCOUNTER — Ambulatory Visit: Payer: Self-pay

## 2019-06-06 ENCOUNTER — Other Ambulatory Visit: Payer: Self-pay

## 2019-06-06 ENCOUNTER — Encounter: Payer: Self-pay | Admitting: Family Medicine

## 2019-06-06 ENCOUNTER — Ambulatory Visit: Payer: BC Managed Care – PPO | Admitting: Family Medicine

## 2019-06-06 DIAGNOSIS — M1612 Unilateral primary osteoarthritis, left hip: Secondary | ICD-10-CM

## 2019-06-06 NOTE — Progress Notes (Signed)
Subjective: Patient is here for ultrasound-guided intra-articular left hip injection.   Has moderate-severe DJD.  Objective:  Pain with IR.  Procedure: Ultrasound-guided left hip injection: After sterile prep with Betadine, injected 8 cc 1% lidocaine without epinephrine and 40 mg methylprednisolone using a 22-gauge spinal needle, passing the needle through the iliofemoral ligament into the femoral head/neck junction.  Injectate seen filling joint capsule.  Follow up with Dr. Magnus Ivan in 1 month.

## 2019-07-07 ENCOUNTER — Encounter: Payer: Self-pay | Admitting: Orthopaedic Surgery

## 2019-07-07 ENCOUNTER — Other Ambulatory Visit: Payer: Self-pay

## 2019-07-07 ENCOUNTER — Ambulatory Visit: Payer: BC Managed Care – PPO | Admitting: Orthopaedic Surgery

## 2019-07-07 DIAGNOSIS — M25552 Pain in left hip: Secondary | ICD-10-CM | POA: Diagnosis not present

## 2019-07-07 DIAGNOSIS — M1612 Unilateral primary osteoarthritis, left hip: Secondary | ICD-10-CM | POA: Diagnosis not present

## 2019-07-07 NOTE — Progress Notes (Signed)
The patient comes in about 4 weeks after having an intra-articular injection of a steroid in his left hip to treat the pain from osteoarthritis.  He states that the injection was about 80% effective.  He does have known severe arthritis in his left hip but it has made it more manageable.  He is only 59 years old and active.  He does try heat at night and takes an occasional Tylenol arthritis or even over-the-counter anti-inflammatories.  He is on glucosamine.  On examination his right hip moves smoothly and is normal.  His left hip has still a lot of stiffness with pain with internal and external rotation.  I had a long thorough discussion with him about hip replacement surgery but we still want to hold off on this until he gets to the point where his pain is definitely affecting his mobility, his quality of life and his actives daily living on a daily basis.  Right now the pain is manageable.  If it worsens in any way he will let us know.  He understands he needs to wait at least 6 to 8 months between steroid injections in the hip due to the detrimental effect he can have on just the bone in general.  All questions and concerns were answered and addressed.

## 2020-05-07 ENCOUNTER — Ambulatory Visit: Payer: BC Managed Care – PPO | Admitting: Orthopaedic Surgery

## 2020-05-07 ENCOUNTER — Other Ambulatory Visit: Payer: Self-pay

## 2020-05-07 VITALS — Ht 72.0 in | Wt 226.0 lb

## 2020-05-07 DIAGNOSIS — M1612 Unilateral primary osteoarthritis, left hip: Secondary | ICD-10-CM

## 2020-05-07 DIAGNOSIS — M25552 Pain in left hip: Secondary | ICD-10-CM | POA: Diagnosis not present

## 2020-05-07 NOTE — Progress Notes (Signed)
The patient is well-known to me.  He is quite significant arthritis in his left hip that is been well-documented with x-rays and clinical exam.  He has been slowly getting worse and he would like to consider hip replacement at this point in later July of this year.  It has been 11 months since his last intra-articular steroid injection of the left hip with Dr. Prince Rome that under ultrasound.  He is interested in having that type of injection again.  He has had no acute change in medical status.  He has tried and failed conservative treatment for quite some time now.  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  Examination of his left hip does show significant stiffness with internal and external rotation with pain in the joint and over the trochanteric area.  We will see if Dr. Prince Rome can see him sometime this week for an intra-articular injection under ultrasound of a steroid in that left hip joint.  I would then like to see him back sometime in early June so we can see about scheduling for hip replacement surgery for that left hip in later July.

## 2020-05-11 ENCOUNTER — Ambulatory Visit: Payer: BC Managed Care – PPO | Admitting: Family Medicine

## 2020-05-11 ENCOUNTER — Ambulatory Visit: Payer: Self-pay

## 2020-05-11 ENCOUNTER — Other Ambulatory Visit: Payer: Self-pay

## 2020-05-11 DIAGNOSIS — M1612 Unilateral primary osteoarthritis, left hip: Secondary | ICD-10-CM | POA: Diagnosis not present

## 2020-05-11 NOTE — Progress Notes (Signed)
Subjective: Patient is here for ultrasound-guided intra-articular left hip injection.   Last injection helped for almost a year.  Objective: Pain with passive IR.  Procedure: Ultrasound guided injection is preferred based studies that show increased duration, increased effect, greater accuracy, decreased procedural pain, increased response rate, and decreased cost with ultrasound guided versus blind injection.   Verbal informed consent obtained.  Time-out conducted.  Noted no overlying erythema, induration, or other signs of local infection. Ultrasound-guided left hip injection: After sterile prep with Betadine, injected 4 cc 0.25% bupivacaine without epinephrine and 6 mg betamethasone using a 22-gauge spinal needle, passing the needle through the iliofemoral ligament into the femoral head/neck junction.  Injectate seen filling the joint capsule.

## 2020-07-09 ENCOUNTER — Ambulatory Visit: Payer: BC Managed Care – PPO | Admitting: Orthopaedic Surgery

## 2020-07-09 ENCOUNTER — Other Ambulatory Visit: Payer: Self-pay

## 2020-07-09 VITALS — Ht 72.5 in | Wt 222.8 lb

## 2020-07-09 DIAGNOSIS — M1612 Unilateral primary osteoarthritis, left hip: Secondary | ICD-10-CM | POA: Diagnosis not present

## 2020-07-09 DIAGNOSIS — M25552 Pain in left hip: Secondary | ICD-10-CM

## 2020-07-09 NOTE — Progress Notes (Signed)
The patient is well-known to me.  He is only 60 years old and has well-documented severe arthritis of his left hip.  He has now had at least 2 intra-articular steroid injections in that left hip.  The last injection in April did not help as much as the first injection.  His x-rays show bone-on-bone arthritis of the left hip.  He is a thin individual.  He is only 60 years old.  He has tried and failed conservative treatment for over 12 months including activity modification, hip strengthening exercises, oral anti-inflammatories and several steroid injections.  At this point his left hip pain is daily and is detrimentally fighting his mobility, his quality of life and his actives daily living.  He is interested in proceeding with left hip replacement surgery later this summer.  He is not on any blood thinning medications.  He is not a diabetic and does not have any significant medical issues otherwise.  He currently denies any headache, chest pain, shortness of breath, fever, chills, nausea, vomiting.  He had no active acute changes in medical status.  Examination of his right hip is normal.  Examination of his left painful hip shows limitations with internal and external rotation with significant stiffness and pain in the groin with rotation.  It does radiate down to his knee as well.  There is also associated trochanteric pain and IT band pain.  Again the x-rays of his left hip previously obtained show severe end-stage arthritis with joint space narrowing and particular osteophytes as well as sclerotic and cystic changes.  At this point we are comfortable with proceeding with left total hip arthroplasty given the failed conservative treatment and given his continued pain to the left hip.  I went over hip replacement surgery with him in detail showing a hip model and giving him a handout about hip replacement surgery.  We discussed the risks and benefits of this type of surgery.  I talked about the  interoperative and postoperative course and what to expect.  All questions and concerns were answered and addressed.  We will hopefully work on getting this scheduled for later in July.

## 2020-07-24 ENCOUNTER — Other Ambulatory Visit: Payer: Self-pay

## 2020-07-24 ENCOUNTER — Telehealth: Payer: Self-pay | Admitting: Physician Assistant

## 2020-07-24 NOTE — Telephone Encounter (Signed)
Received $25.00 cash,medical records release form and disability paperwork from patient/Forwarding to [employer] today  

## 2020-08-14 ENCOUNTER — Other Ambulatory Visit: Payer: Self-pay | Admitting: Physician Assistant

## 2020-08-21 NOTE — Progress Notes (Addendum)
COVID Vaccine Completed:  Yes x4 Date COVID Vaccine completed: Has received booster: Yes x2 COVID vaccine manufacturer: Pfizer    [employer]   Date of COVID positive in last 90 days: N/A  PCP - Daisy Floro, MD Cardiologist - N/A  Chest x-ray - N/A EKG - 08-22-20 Epic Stress Test - N/A ECHO - N/A Cardiac Cath - N/A Pacemaker/ICD device last checked: Spinal Cord Stimulator:  Sleep Study - N/A CPAP -   Fasting Blood Sugar - N/A Checks Blood Sugar _____ times a day  Blood Thinner Instructions:  N/A Aspirin Instructions:   Last Dose:  Activity level:  Can go up a flight of stairs and perform activities of daily living without stopping and without symptoms of chest pain or shortness of breath.    Anesthesia review:  N/A  Patient denies shortness of breath, fever, cough and chest pain at PAT appointment   Patient verbalized understanding of instructions that were given to them at the PAT appointment. Patient was also instructed that they will need to review over the PAT instructions again at home before surgery.

## 2020-08-21 NOTE — Patient Instructions (Addendum)
DUE TO COVID-19 ONLY ONE VISITOR IS ALLOWED TO COME WITH YOU AND STAY IN THE WAITING ROOM ONLY DURING PRE OP AND PROCEDURE.   **NO VISITORS ARE ALLOWED IN THE SHORT STAY AREA OR RECOVERY ROOM!!**  IF YOU WILL BE ADMITTED INTO THE HOSPITAL YOU ARE ALLOWED ONLY TWO SUPPORT PEOPLE DURING VISITATION HOURS ONLY (10AM -8PM)   The support person(s) may change daily. The support person(s) must pass our screening, gel in and out, and wear a mask at all times, including in the patient's room. Patients must also wear a mask when staff or their support person are in the room.  No visitors under the age of 59. Any visitor under the age of 59 must be accompanied by an adult.    COVID SWAB TESTING MUST BE COMPLETED ON:  Wednesday 08-22-20 @ 2:40 PM   [address] [place], [place] [id number]   You are not required to quarantine, however you are required to wear a well-fitted mask when you are out and around people not in your household.  Hand Hygiene often Do NOT share personal items Notify your provider if you are in close contact with someone who has COVID or you develop fever 100.4 or greater, new onset of sneezing, cough, sore throat, shortness of breath or body aches.        Your procedure is scheduled on:  Friday, 08-24-20    [facility]   Report to admitting at 9:45 AM   Call this number if you have problems the morning of surgery [phone]   Do not eat food :After Midnight.   May have liquids until 9:15 AM day of surgery  CLEAR LIQUID DIET  Foods Allowed                                                                     Foods Excluded  Water, Black Coffee and tea, regular and decaf               liquids that you cannot  Plain Jell-O in any flavor  (No red)                                     see through such as: Fruit ices (not with fruit pulp)                                      milk, soups, orange juice              Iced Popsicles (No red)                                       All solid food                                   Apple juices Sports drinks like Gatorade (No red) Lightly seasoned clear broth or consume(fat free) Sugar, honey  syrup     Complete one G2 drink the morning of surgery at  9:15 AM  the day of surgery.      Oral Hygiene is also important to reduce your risk of infection.                                    Remember - BRUSH YOUR TEETH THE MORNING OF SURGERY WITH YOUR REGULAR TOOTHPASTE   Do NOT smoke after Midnight   Take these medicines the morning of surgery with A SIP OF WATER:  Amlodipine                             You may not have any metal on your body including jewelry, and body piercing             Do not wear lotions, powders, cologne, or deodorant              Men may shave face and neck.   Do not bring valuables to the hospital. [facility] IS NOT RESPONSIBLE   FOR VALUABLES.   Contacts, dentures or bridgework may not be worn into surgery.   Bring small overnight bag day of surgery.   Please read over the following fact sheets you were given: IF YOU HAVE QUESTIONS ABOUT YOUR PRE OP INSTRUCTIONS PLEASE CALL [phone] [facility] - Preparing for Surgery Before surgery, you can play an important role.  Because skin is not sterile, your skin needs to be as free of germs as possible.  You can reduce the number of germs on your skin by washing with CHG (chlorahexidine gluconate) soap before surgery.  CHG is an antiseptic cleaner which kills germs and bonds with the skin to continue killing germs even after washing. Please DO NOT use if you have an allergy to CHG or antibacterial soaps.  If your skin becomes reddened/irritated stop using the CHG and inform your nurse when you arrive at Short Stay. Do not shave (including legs and underarms) for at least 48 hours prior to the first CHG shower.  You may shave your face/neck.  Please follow these instructions carefully:  1.  Shower with CHG  Soap the night before surgery and the  morning of surgery.  2.  If you choose to wash your hair, wash your hair first as usual with your normal  shampoo.  3.  After you shampoo, rinse your hair and body thoroughly to remove the shampoo.                             4.  Use CHG as you would any other liquid soap.  You can apply chg directly to the skin and wash.  Gently with a scrungie or clean washcloth.  5.  Apply the CHG Soap to your body ONLY FROM THE NECK DOWN.   Do   not use on face/ open                           Wound or open sores. Avoid contact with eyes, ears mouth and   genitals (private parts).                       Wash face,  Medical illustrator (private parts)  with your normal soap.             6.  Wash thoroughly, paying special attention to the area where your    surgery  will be performed.  7.  Thoroughly rinse your body with warm water from the neck down.  8.  DO NOT shower/wash with your normal soap after using and rinsing off the CHG Soap.                9.  Pat yourself dry with a clean towel.            10.  Wear clean pajamas.            11.  Place clean sheets on your bed the night of your first shower and do not  sleep with pets. Day of Surgery : Do not apply any lotions/deodorants the morning of surgery.  Please wear clean clothes to the hospital/surgery center.  FAILURE TO FOLLOW THESE INSTRUCTIONS MAY RESULT IN THE CANCELLATION OF YOUR SURGERY  PATIENT SIGNATURE_________________________________  NURSE SIGNATURE__________________________________  ________________________________________________________________________   Gregory Howard  An incentive spirometer is a tool that can help keep your lungs clear and active. This tool measures how well you are filling your lungs with each breath. Taking long deep breaths may help reverse or decrease the chance of developing breathing (pulmonary) problems (especially infection) following: A long period of time when you are  unable to move or be active. BEFORE THE PROCEDURE  If the spirometer includes an indicator to show your best effort, your nurse or respiratory therapist will set it to a desired goal. If possible, sit up straight or lean slightly forward. Try not to slouch. Hold the incentive spirometer in an upright position. INSTRUCTIONS FOR USE  Sit on the edge of your bed if possible, or sit up as far as you can in bed or on a chair. Hold the incentive spirometer in an upright position. Breathe out normally. Place the mouthpiece in your mouth and seal your lips tightly around it. Breathe in slowly and as deeply as possible, raising the piston or the ball toward the top of the column. Hold your breath for 3-5 seconds or for as long as possible. Allow the piston or ball to fall to the bottom of the column. Remove the mouthpiece from your mouth and breathe out normally. Rest for a few seconds and repeat Steps 1 through 7 at least 10 times every 1-2 hours when you are awake. Take your time and take a few normal breaths between deep breaths. The spirometer may include an indicator to show your best effort. Use the indicator as a goal to work toward during each repetition. After each set of 10 deep breaths, practice coughing to be sure your lungs are clear. If you have an incision (the cut made at the time of surgery), support your incision when coughing by placing a pillow or rolled up towels firmly against it. Once you are able to get out of bed, walk around indoors and cough well. You may stop using the incentive spirometer when instructed by your caregiver.  RISKS AND COMPLICATIONS Take your time so you do not get dizzy or light-headed. If you are in pain, you may need to take or ask for pain medication before doing incentive spirometry. It is harder to take a deep breath if you are having pain. AFTER USE Rest and breathe slowly and easily. It can be helpful to keep track of a log of  your progress. Your  caregiver can provide you with a simple table to help with this. If you are using the spirometer at home, follow these instructions: SEEK MEDICAL CARE IF:  You are having difficultly using the spirometer. You have trouble using the spirometer as often as instructed. Your pain medication is not giving enough relief while using the spirometer. You develop fever of 100.5 F (38.1 C) or higher. SEEK IMMEDIATE MEDICAL CARE IF:  You cough up bloody sputum that had not been present before. You develop fever of 102 F (38.9 C) or greater. You develop worsening pain at or near the incision site. MAKE SURE YOU:  Understand these instructions. Will watch your condition. Will get help right away if you are not doing well or get worse. Document Released: 06/02/2006 Document Revised: 04/14/2011 Document Reviewed: 08/03/2006 [employer] Patient Information 2014 [employer], [place].   ________________________________________________________________________  WHAT IS A BLOOD TRANSFUSION? Blood Transfusion Information  A transfusion is the replacement of blood or some of its parts. Blood is made up of multiple cells which provide different functions. Red blood cells carry oxygen and are used for blood loss replacement. White blood cells fight against infection. Platelets control bleeding. Plasma helps clot blood. Other blood products are available for specialized needs, such as hemophilia or other clotting disorders. BEFORE THE TRANSFUSION  Who gives blood for transfusions?  Healthy volunteers who are fully evaluated to make sure their blood is safe. This is blood bank blood. Transfusion therapy is the safest it has ever been in the practice of medicine. Before blood is taken from a donor, a complete history is taken to make sure that person has no history of diseases nor engages in risky social behavior (examples are intravenous drug use or sexual activity with multiple partners). The donor's travel history  is screened to minimize risk of transmitting infections, such as malaria. The donated blood is tested for signs of infectious diseases, such as HIV and hepatitis. The blood is then tested to be sure it is compatible with you in order to minimize the chance of a transfusion reaction. If you or a relative donates blood, this is often done in anticipation of surgery and is not appropriate for emergency situations. It takes many days to process the donated blood. RISKS AND COMPLICATIONS Although transfusion therapy is very safe and saves many lives, the main dangers of transfusion include:  Getting an infectious disease. Developing a transfusion reaction. This is an allergic reaction to something in the blood you were given. Every precaution is taken to prevent this. The decision to have a blood transfusion has been considered carefully by your caregiver before blood is given. Blood is not given unless the benefits outweigh the risks. AFTER THE TRANSFUSION Right after receiving a blood transfusion, you will usually feel much better and more energetic. This is especially true if your red blood cells have gotten low (anemic). The transfusion raises the level of the red blood cells which carry oxygen, and this usually causes an energy increase. The nurse administering the transfusion will monitor you carefully for complications. HOME CARE INSTRUCTIONS  No special instructions are needed after a transfusion. You may find your energy is better. Speak with your caregiver about any limitations on activity for underlying diseases you may have. SEEK MEDICAL CARE IF:  Your condition is not improving after your transfusion. You develop redness or irritation at the intravenous (IV) site. SEEK IMMEDIATE MEDICAL CARE IF:  Any of the following symptoms occur over the next  12 hours: Shaking chills. You have a temperature by mouth above 102 F (38.9 C), not controlled by medicine. Chest, back, or muscle pain. People  around you feel you are not acting correctly or are confused. Shortness of breath or difficulty breathing. Dizziness and fainting. You get a rash or develop hives. You have a decrease in urine output. Your urine turns a dark color or changes to pink, red, or brown. Any of the following symptoms occur over the next 10 days: You have a temperature by mouth above 102 F (38.9 C), not controlled by medicine. Shortness of breath. Weakness after normal activity. The white part of the eye turns yellow (jaundice). You have a decrease in the amount of urine or are urinating less often. Your urine turns a dark color or changes to pink, red, or brown. Document Released: 01/18/2000 Document Revised: 04/14/2011 Document Reviewed: 09/06/2007 [facility] Patient Information 2014 [place], [place].  _______________________________________________________________________

## 2020-08-22 ENCOUNTER — Encounter (HOSPITAL_COMMUNITY): Payer: Self-pay

## 2020-08-22 ENCOUNTER — Encounter (HOSPITAL_COMMUNITY)
Admission: RE | Admit: 2020-08-22 | Discharge: 2020-08-22 | Disposition: A | Discharge status: Home / Self Care | Payer: BC Managed Care – PPO | Source: Ambulatory Visit | Attending: Orthopaedic Surgery | Admitting: Orthopaedic Surgery

## 2020-08-22 ENCOUNTER — Other Ambulatory Visit: Payer: Self-pay

## 2020-08-22 ENCOUNTER — Other Ambulatory Visit (HOSPITAL_COMMUNITY)
Admission: RE | Admit: 2020-08-22 | Discharge: 2020-08-22 | Disposition: A | Discharge status: Home / Self Care | Payer: BC Managed Care – PPO | Source: Ambulatory Visit | Attending: Orthopaedic Surgery | Admitting: Orthopaedic Surgery

## 2020-08-22 DIAGNOSIS — Z01818 Encounter for other preprocedural examination: Secondary | ICD-10-CM | POA: Insufficient documentation

## 2020-08-22 DIAGNOSIS — Z20822 Contact with and (suspected) exposure to covid-19: Secondary | ICD-10-CM | POA: Diagnosis not present

## 2020-08-22 HISTORY — DX: Unspecified osteoarthritis, unspecified site: M19.90

## 2020-08-22 HISTORY — DX: Prediabetes: R73.03

## 2020-08-22 LAB — BASIC METABOLIC PANEL
Anion gap: 8 (ref 5–15)
BUN: 17 mg/dL (ref 6–20)
CO2: 29 mmol/L (ref 22–32)
Calcium: 9.5 mg/dL (ref 8.9–10.3)
Chloride: 102 mmol/L (ref 98–111)
Creatinine, Ser: 0.94 mg/dL (ref 0.61–1.24)
GFR, Estimated: >60 mL/min (ref >60)
Glucose, Bld: 150 mg/dL — ABNORMAL HIGH (ref 70–99)
Potassium: 4.9 mmol/L (ref 3.5–5.1)
Sodium: 139 mmol/L (ref 135–145)

## 2020-08-22 LAB — CBC
HCT: 42.4 % (ref 39.0–52.0)
Hemoglobin: 14.4 g/dL (ref 13.0–17.0)
MCH: 30.4 pg (ref 26.0–34.0)
MCHC: 34 g/dL (ref 30.0–36.0)
MCV: 89.6 fL (ref 80.0–100.0)
Platelets: 282 10*3/uL (ref 150–400)
RBC: 4.73 MIL/uL (ref 4.22–5.81)
RDW: 12.3 % (ref 11.5–15.5)
WBC: 6.2 10*3/uL (ref 4.0–10.5)
nRBC: 0 % (ref 0.0–0.2)

## 2020-08-22 LAB — SURGICAL PCR SCREEN
MRSA, PCR: NEGATIVE
Staphylococcus aureus: NEGATIVE

## 2020-08-22 LAB — HEMOGLOBIN A1C
Hgb A1c MFr Bld: 6.2 % — ABNORMAL HIGH (ref 4.8–5.6)
Mean Plasma Glucose: 131.24 mg/dL

## 2020-08-22 LAB — SARS CORONAVIRUS 2 (TAT 6-24 HRS): SARS Coronavirus 2: NEGATIVE

## 2020-08-23 NOTE — H&P (Signed)
TOTAL HIP ADMISSION H&P  Patient is admitted for left total hip arthroplasty.  Subjective:  Chief Complaint: left hip pain  HPI: Gregory Howard, 60 y.o. male, has a history of pain and functional disability in the left hip(s) due to arthritis and patient has failed non-surgical conservative treatments for greater than 12 weeks to include NSAID's and/or analgesics, corticosteriod injections, use of assistive devices, and activity modification.  Onset of symptoms was gradual starting 5 years ago with gradually worsening course since that time.The patient noted no past surgery on the left hip(s).  Patient currently rates pain in the left hip at 10 out of 10 with activity. Patient has night pain, worsening of pain with activity and weight bearing, trendelenberg gait, pain that interfers with activities of daily living, and pain with passive range of motion. Patient has evidence of subchondral cysts, subchondral sclerosis, periarticular osteophytes, and joint space narrowing by imaging studies. This condition presents safety issues increasing the risk of falls.  There is no current active infection.  Patient Active Problem List   Diagnosis Date Noted   Unilateral primary osteoarthritis, left hip 05/25/2019   Past Medical History:  Diagnosis Date   Arthritis    History of kidney stones    Hypertension    Kidney stones    Pre-diabetes     Past Surgical History:  Procedure Laterality Date   EXTRACORPOREAL SHOCK WAVE LITHOTRIPSY Right 03/23/2017   Procedure: RIGHT EXTRACORPOREAL SHOCK WAVE LITHOTRIPSY (ESWL);  Surgeon: Marcine Matar, MD;  Location: [facility];  Service: Urology;  Laterality: Right;   HERNIA REPAIR     done as child   LITHOTRIPSY      No current facility-administered medications for this encounter.   Current Outpatient Medications  Medication Sig Dispense Refill Last Dose   acetaminophen (TYLENOL) 500 MG tablet Take 500 mg by mouth every 6 (six) hours as needed (pain).       amLODipine (NORVASC) 5 MG tablet Take 5 mg by mouth in the morning.      Cholecalciferol (VITAMIN D3) 125 MCG (5000 UT) TABS Take 5,000 Units by mouth every evening.      Omega-3 Fatty Acids (FISH OIL) 1200 MG CAPS Take 2,400 mg by mouth in the morning.      quinapril (ACCUPRIL) 40 MG tablet Take 40 mg by mouth in the morning.      Tamsulosin HCl (FLOMAX) 0.4 MG CAPS Take 1 capsule (0.4 mg total) by mouth daily. (Patient taking differently: Take 0.4 mg by mouth every evening.) 30 capsule 0    Turmeric 500 MG TABS Take by mouth.      vitamin B-12 (CYANOCOBALAMIN) 500 MCG tablet Take 500 mcg by mouth in the morning.      No Known Allergies  Social History   Tobacco Use   Smoking status: Former   Smokeless tobacco: Never  Substance Use Topics   Alcohol use: No    No family history on file.   Review of Systems  Musculoskeletal:  Positive for gait problem.  All other systems reviewed and are negative.  Objective:  Physical Exam Vitals reviewed.  Constitutional:      Appearance: Normal appearance.  HENT:     Head: Normocephalic and atraumatic.  Eyes:     Extraocular Movements: Extraocular movements intact.     Pupils: Pupils are equal, round, and reactive to light.  Cardiovascular:     Rate and Rhythm: Normal rate and regular rhythm.  Pulmonary:     Effort: Pulmonary effort is  normal.     Breath sounds: Normal breath sounds.  Abdominal:     Palpations: Abdomen is soft.  Musculoskeletal:     Cervical back: Normal range of motion and neck supple.     Left hip: Tenderness and bony tenderness present. Decreased range of motion. Decreased strength.  Neurological:     Mental Status: He is alert and oriented to person, place, and time.  Psychiatric:        Behavior: Behavior normal.    Vital signs in last 24 hours:    Labs:   Estimated body mass index is 29.89 kg/m as calculated from the following:   Height as of 08/22/20: 6' (1.829 m).   Weight as of 08/22/20: 100  kg.   Imaging Review Plain radiographs demonstrate severe degenerative joint disease of the left hip(s). The bone quality appears to be excellent for age and reported activity level.      Assessment/Plan:  End stage arthritis, left hip(s)  The patient history, physical examination, clinical judgement of the provider and imaging studies are consistent with end stage degenerative joint disease of the left hip(s) and total hip arthroplasty is deemed medically necessary. The treatment options including medical management, injection therapy, arthroscopy and arthroplasty were discussed at length. The risks and benefits of total hip arthroplasty were presented and reviewed. The risks due to aseptic loosening, infection, stiffness, dislocation/subluxation,  thromboembolic complications and other imponderables were discussed.  The patient acknowledged the explanation, agreed to proceed with the plan and consent was signed. Patient is being admitted for inpatient treatment for surgery, pain control, PT, OT, prophylactic antibiotics, VTE prophylaxis, progressive ambulation and ADL's and discharge planning.The patient is planning to be discharged home with home health services

## 2020-08-24 ENCOUNTER — Encounter (HOSPITAL_COMMUNITY): Payer: Self-pay | Admitting: Orthopaedic Surgery

## 2020-08-24 ENCOUNTER — Encounter (HOSPITAL_COMMUNITY)
Admission: RE | Disposition: A | Discharge status: Home / Self Care | Payer: Self-pay | Source: Home / Self Care | Attending: Orthopaedic Surgery

## 2020-08-24 ENCOUNTER — Ambulatory Visit (HOSPITAL_COMMUNITY): Payer: BC Managed Care – PPO | Admitting: Anesthesiology

## 2020-08-24 ENCOUNTER — Other Ambulatory Visit: Payer: Self-pay

## 2020-08-24 ENCOUNTER — Ambulatory Visit (HOSPITAL_COMMUNITY): Payer: BC Managed Care – PPO

## 2020-08-24 ENCOUNTER — Observation Stay (HOSPITAL_COMMUNITY): Payer: BC Managed Care – PPO

## 2020-08-24 ENCOUNTER — Observation Stay (HOSPITAL_COMMUNITY)
Admission: RE | Admit: 2020-08-24 | Discharge: 2020-08-25 | Disposition: A | Discharge status: Home / Self Care | Payer: BC Managed Care – PPO | Attending: Orthopaedic Surgery | Admitting: Orthopaedic Surgery

## 2020-08-24 DIAGNOSIS — Z79899 Other long term (current) drug therapy: Secondary | ICD-10-CM | POA: Insufficient documentation

## 2020-08-24 DIAGNOSIS — I10 Essential (primary) hypertension: Secondary | ICD-10-CM | POA: Diagnosis not present

## 2020-08-24 DIAGNOSIS — Z96642 Presence of left artificial hip joint: Secondary | ICD-10-CM

## 2020-08-24 DIAGNOSIS — Z87891 Personal history of nicotine dependence: Secondary | ICD-10-CM | POA: Insufficient documentation

## 2020-08-24 DIAGNOSIS — Z96641 Presence of right artificial hip joint: Secondary | ICD-10-CM

## 2020-08-24 DIAGNOSIS — M1612 Unilateral primary osteoarthritis, left hip: Principal | ICD-10-CM | POA: Insufficient documentation

## 2020-08-24 DIAGNOSIS — R7303 Prediabetes: Secondary | ICD-10-CM | POA: Diagnosis not present

## 2020-08-24 HISTORY — PX: TOTAL HIP ARTHROPLASTY: SHX124

## 2020-08-24 LAB — TYPE AND SCREEN
ABO/RH(D): B POS
Antibody Screen: NEGATIVE

## 2020-08-24 LAB — ABO/RH: ABO/RH(D): B POS

## 2020-08-24 SURGERY — ARTHROPLASTY, HIP, TOTAL, ANTERIOR APPROACH
Anesthesia: Monitor Anesthesia Care | Site: Hip | Laterality: Left

## 2020-08-24 MED ORDER — ONDANSETRON HCL 4 MG PO TABS
4.0000 mg | ORAL_TABLET | Freq: Four times a day (QID) | ORAL | Status: DC | PRN
  Administered 2020-08-25: 4 mg via ORAL
  Filled 2020-08-24: qty 1

## 2020-08-24 MED ORDER — ONDANSETRON HCL 4 MG/2ML IJ SOLN
INTRAMUSCULAR | Status: DC | PRN
  Administered 2020-08-24: 4 mg via INTRAVENOUS

## 2020-08-24 MED ORDER — CHLORHEXIDINE GLUCONATE 0.12 % MT SOLN
15.0000 mL | Freq: Once | OROMUCOSAL | Status: AC
  Administered 2020-08-24: 15 mL via OROMUCOSAL

## 2020-08-24 MED ORDER — MEPERIDINE HCL 50 MG/ML IJ SOLN: 6.2500 mg | INTRAMUSCULAR | Status: DC | PRN

## 2020-08-24 MED ORDER — CEFAZOLIN SODIUM-DEXTROSE 1-4 GM/50ML-% IV SOLN
1.0000 g | Freq: Four times a day (QID) | INTRAVENOUS | Status: AC
Start: 2020-08-24 — End: 2020-08-24
  Administered 2020-08-24 (×2): 1 g via INTRAVENOUS
  Filled 2020-08-24 (×2): qty 50

## 2020-08-24 MED ORDER — LACTATED RINGERS IV SOLN
INTRAVENOUS | Status: DC
Start: 2020-08-24 — End: 2020-08-24

## 2020-08-24 MED ORDER — TRANEXAMIC ACID-NACL 1000-0.7 MG/100ML-% IV SOLN
1000.0000 mg | INTRAVENOUS | Status: AC
  Administered 2020-08-24: 1000 mg via INTRAVENOUS
  Filled 2020-08-24: qty 100

## 2020-08-24 MED ORDER — ACETAMINOPHEN 160 MG/5ML PO SOLN: 325.0000 mg | ORAL | Status: DC | PRN

## 2020-08-24 MED ORDER — METHOCARBAMOL 500 MG IVPB - SIMPLE MED
500.0000 mg | Freq: Four times a day (QID) | INTRAVENOUS | Status: DC | PRN
  Filled 2020-08-24: qty 50

## 2020-08-24 MED ORDER — OXYCODONE HCL 5 MG/5ML PO SOLN
5.0000 mg | Freq: Once | ORAL | Status: DC | PRN
Start: 2020-08-24 — End: 2020-08-24

## 2020-08-24 MED ORDER — ALUM & MAG HYDROXIDE-SIMETH 200-200-20 MG/5ML PO SUSP: 30.0000 mL | ORAL | Status: DC | PRN

## 2020-08-24 MED ORDER — FENTANYL CITRATE (PF) 100 MCG/2ML IJ SOLN: 25.0000 ug | INTRAMUSCULAR | Status: DC | PRN

## 2020-08-24 MED ORDER — EPHEDRINE SULFATE 50 MG/ML IJ SOLN
INTRAMUSCULAR | Status: DC | PRN
  Administered 2020-08-24 (×2): 10 mg via INTRAVENOUS

## 2020-08-24 MED ORDER — PHENOL 1.4 % MT LIQD: 1.0000 | OROMUCOSAL | Status: DC | PRN

## 2020-08-24 MED ORDER — FENTANYL CITRATE (PF) 100 MCG/2ML IJ SOLN
INTRAMUSCULAR | Status: DC | PRN
  Administered 2020-08-24: 100 ug via INTRAVENOUS

## 2020-08-24 MED ORDER — ONDANSETRON HCL 4 MG/2ML IJ SOLN: 4.0000 mg | Freq: Four times a day (QID) | INTRAMUSCULAR | Status: DC | PRN

## 2020-08-24 MED ORDER — SODIUM CHLORIDE 0.9 % IV SOLN: INTRAVENOUS | Status: DC

## 2020-08-24 MED ORDER — ONDANSETRON HCL 4 MG/2ML IJ SOLN: 4.0000 mg | Freq: Once | INTRAMUSCULAR | Status: DC | PRN

## 2020-08-24 MED ORDER — PROPOFOL 1000 MG/100ML IV EMUL
INTRAVENOUS | Status: AC
  Filled 2020-08-24: qty 300

## 2020-08-24 MED ORDER — FENTANYL CITRATE (PF) 100 MCG/2ML IJ SOLN
INTRAMUSCULAR | Status: AC
  Filled 2020-08-24: qty 2

## 2020-08-24 MED ORDER — BUPIVACAINE IN DEXTROSE 0.75-8.25 % IT SOLN
INTRATHECAL | Status: DC | PRN
  Administered 2020-08-24: 2 mL via INTRATHECAL

## 2020-08-24 MED ORDER — CYANOCOBALAMIN 500 MCG PO TABS
500.0000 ug | ORAL_TABLET | Freq: Every day | ORAL | Status: DC
  Administered 2020-08-25: 500 ug via ORAL
  Filled 2020-08-24: qty 1

## 2020-08-24 MED ORDER — MIDAZOLAM HCL 5 MG/5ML IJ SOLN
INTRAMUSCULAR | Status: DC | PRN
  Administered 2020-08-24: 2 mg via INTRAVENOUS

## 2020-08-24 MED ORDER — PANTOPRAZOLE SODIUM 40 MG PO TBEC
40.0000 mg | DELAYED_RELEASE_TABLET | Freq: Every day | ORAL | Status: DC
  Administered 2020-08-25: 40 mg via ORAL
  Filled 2020-08-24: qty 1

## 2020-08-24 MED ORDER — DOCUSATE SODIUM 100 MG PO CAPS
100.0000 mg | ORAL_CAPSULE | Freq: Two times a day (BID) | ORAL | Status: DC
  Administered 2020-08-24 – 2020-08-25 (×2): 100 mg via ORAL
  Filled 2020-08-24 (×2): qty 1

## 2020-08-24 MED ORDER — PHENYLEPHRINE HCL (PRESSORS) 10 MG/ML IV SOLN
INTRAVENOUS | Status: DC | PRN
  Administered 2020-08-24: 120 ug via INTRAVENOUS
  Administered 2020-08-24: 80 ug via INTRAVENOUS

## 2020-08-24 MED ORDER — METOCLOPRAMIDE HCL 5 MG/ML IJ SOLN: 5.0000 mg | Freq: Three times a day (TID) | INTRAMUSCULAR | Status: DC | PRN

## 2020-08-24 MED ORDER — ACETAMINOPHEN 325 MG PO TABS: 325.0000 mg | ORAL_TABLET | Freq: Four times a day (QID) | ORAL | Status: DC | PRN

## 2020-08-24 MED ORDER — VITAMIN D 25 MCG (1000 UNIT) PO TABS
5000.0000 [IU] | ORAL_TABLET | Freq: Every evening | ORAL | Status: DC
  Administered 2020-08-24: 5000 [IU] via ORAL
  Filled 2020-08-24: qty 5

## 2020-08-24 MED ORDER — OXYCODONE HCL 5 MG PO TABS
5.0000 mg | ORAL_TABLET | ORAL | Status: DC | PRN
  Administered 2020-08-25 (×2): 10 mg via ORAL
  Filled 2020-08-24 (×3): qty 2

## 2020-08-24 MED ORDER — ORAL CARE MOUTH RINSE: 15.0000 mL | Freq: Once | OROMUCOSAL | Status: AC

## 2020-08-24 MED ORDER — 0.9 % SODIUM CHLORIDE (POUR BTL) OPTIME
TOPICAL | Status: DC | PRN
  Administered 2020-08-24: 1000 mL

## 2020-08-24 MED ORDER — ASPIRIN 81 MG PO CHEW
81.0000 mg | CHEWABLE_TABLET | Freq: Two times a day (BID) | ORAL | Status: DC
  Administered 2020-08-24 – 2020-08-25 (×2): 81 mg via ORAL
  Filled 2020-08-24 (×2): qty 1

## 2020-08-24 MED ORDER — SODIUM CHLORIDE 0.9 % IV SOLN
2.0000 g | INTRAVENOUS | Status: AC
  Administered 2020-08-24: 2 g via INTRAVENOUS
  Filled 2020-08-24: qty 2

## 2020-08-24 MED ORDER — DEXAMETHASONE SODIUM PHOSPHATE 10 MG/ML IJ SOLN
INTRAMUSCULAR | Status: DC | PRN
  Administered 2020-08-24: 10 mg via INTRAVENOUS

## 2020-08-24 MED ORDER — ACETAMINOPHEN 325 MG PO TABS: 325.0000 mg | ORAL_TABLET | ORAL | Status: DC | PRN

## 2020-08-24 MED ORDER — METOCLOPRAMIDE HCL 5 MG PO TABS: 5.0000 mg | ORAL_TABLET | Freq: Three times a day (TID) | ORAL | Status: DC | PRN

## 2020-08-24 MED ORDER — AMLODIPINE BESYLATE 5 MG PO TABS
5.0000 mg | ORAL_TABLET | Freq: Every day | ORAL | Status: DC
  Administered 2020-08-25: 5 mg via ORAL
  Filled 2020-08-24: qty 1

## 2020-08-24 MED ORDER — MENTHOL 3 MG MT LOZG: 1.0000 | LOZENGE | OROMUCOSAL | Status: DC | PRN

## 2020-08-24 MED ORDER — MIDAZOLAM HCL 2 MG/2ML IJ SOLN
INTRAMUSCULAR | Status: AC
  Filled 2020-08-24: qty 2

## 2020-08-24 MED ORDER — DIPHENHYDRAMINE HCL 12.5 MG/5ML PO ELIX: 12.5000 mg | ORAL_SOLUTION | ORAL | Status: DC | PRN

## 2020-08-24 MED ORDER — HYDROMORPHONE HCL 1 MG/ML IJ SOLN: 0.5000 mg | INTRAMUSCULAR | Status: DC | PRN

## 2020-08-24 MED ORDER — PROPOFOL 500 MG/50ML IV EMUL
INTRAVENOUS | Status: DC | PRN
  Administered 2020-08-24: 75 ug/kg/min via INTRAVENOUS

## 2020-08-24 MED ORDER — PHENYLEPHRINE 40 MCG/ML (10ML) SYRINGE FOR IV PUSH (FOR BLOOD PRESSURE SUPPORT)
PREFILLED_SYRINGE | INTRAVENOUS | Status: AC
  Filled 2020-08-24: qty 10

## 2020-08-24 MED ORDER — OXYCODONE HCL 5 MG PO TABS
10.0000 mg | ORAL_TABLET | ORAL | Status: DC | PRN
Start: 2020-08-24 — End: 2020-08-25
  Administered 2020-08-24: 10 mg via ORAL
  Administered 2020-08-24 – 2020-08-25 (×2): 15 mg via ORAL
  Filled 2020-08-24 (×2): qty 3

## 2020-08-24 MED ORDER — METHOCARBAMOL 500 MG PO TABS
500.0000 mg | ORAL_TABLET | Freq: Four times a day (QID) | ORAL | Status: DC | PRN
  Administered 2020-08-24 – 2020-08-25 (×3): 500 mg via ORAL
  Filled 2020-08-24 (×3): qty 1

## 2020-08-24 MED ORDER — POVIDONE-IODINE 10 % EX SWAB
2.0000 "application " | Freq: Once | CUTANEOUS | Status: AC
  Administered 2020-08-24: 2 via TOPICAL

## 2020-08-24 MED ORDER — EPHEDRINE 5 MG/ML INJ
INTRAVENOUS | Status: AC
  Filled 2020-08-24: qty 5

## 2020-08-24 MED ORDER — STERILE WATER FOR IRRIGATION IR SOLN
Status: DC | PRN
  Administered 2020-08-24: 2000 mL

## 2020-08-24 MED ORDER — OXYCODONE HCL 5 MG PO TABS: 5.0000 mg | ORAL_TABLET | Freq: Once | ORAL | Status: DC | PRN

## 2020-08-24 MED ORDER — QUINAPRIL HCL 10 MG PO TABS
40.0000 mg | ORAL_TABLET | Freq: Every day | ORAL | Status: DC
  Administered 2020-08-25: 40 mg via ORAL
  Filled 2020-08-24: qty 4

## 2020-08-24 MED ORDER — TAMSULOSIN HCL 0.4 MG PO CAPS
0.4000 mg | ORAL_CAPSULE | Freq: Every evening | ORAL | Status: DC
  Administered 2020-08-24: 0.4 mg via ORAL
  Filled 2020-08-24: qty 1

## 2020-08-24 SURGICAL SUPPLY — 42 items
ACETAB CUP W/GRIPTION 54 (Plate) ×2 IMPLANT
APL SKNCLS STERI-STRIP NONHPOA (GAUZE/BANDAGES/DRESSINGS)
BAG COUNTER SPONGE SURGICOUNT (BAG) ×2 IMPLANT
BAG ZIPLOCK 12X15 (MISCELLANEOUS) IMPLANT
BENZOIN TINCTURE PRP APPL 2/3 (GAUZE/BANDAGES/DRESSINGS) IMPLANT
BLADE SAW SGTL 18X1.27X75 (BLADE) ×2 IMPLANT
COVER PERINEAL POST (MISCELLANEOUS) ×2 IMPLANT
COVER SURGICAL LIGHT HANDLE (MISCELLANEOUS) ×2 IMPLANT
CUP ACETAB W/GRIPTION 54 (Plate) ×1 IMPLANT
DRAPE FOOT SWITCH (DRAPES) ×2 IMPLANT
DRAPE STERI IOBAN 125X83 (DRAPES) ×2 IMPLANT
DRAPE U-SHAPE 47X51 STRL (DRAPES) ×4 IMPLANT
DRESSING AQUACEL AG SP 3.5X10 (GAUZE/BANDAGES/DRESSINGS) ×1 IMPLANT
DRSG AQUACEL AG ADV 3.5X10 (GAUZE/BANDAGES/DRESSINGS) IMPLANT
DRSG AQUACEL AG SP 3.5X10 (GAUZE/BANDAGES/DRESSINGS) ×2
DURAPREP 26ML APPLICATOR (WOUND CARE) ×2 IMPLANT
ELECT REM PT RETURN 15FT ADLT (MISCELLANEOUS) ×2 IMPLANT
GAUZE XEROFORM 1X8 LF (GAUZE/BANDAGES/DRESSINGS) ×2 IMPLANT
GLOVE SRG 8 PF TXTR STRL LF DI (GLOVE) ×2 IMPLANT
GLOVE SURG ENC MOIS LTX SZ7.5 (GLOVE) ×2 IMPLANT
GLOVE SURG LTX SZ8 (GLOVE) ×2 IMPLANT
GLOVE SURG UNDER POLY LF SZ8 (GLOVE) ×4
GOWN STRL REUS W/TWL XL LVL3 (GOWN DISPOSABLE) ×4 IMPLANT
HANDPIECE INTERPULSE COAX TIP (DISPOSABLE) ×2
HEAD CERAMIC DELTA 36 PLUS 1.5 (Hips) ×2 IMPLANT
HOLDER FOLEY CATH W/STRAP (MISCELLANEOUS) ×2 IMPLANT
KIT TURNOVER KIT A (KITS) ×2 IMPLANT
LINER NEUTRAL 36ID 54OD (Liner) ×2 IMPLANT
PACK ANTERIOR HIP CUSTOM (KITS) ×2 IMPLANT
PENCIL SMOKE EVACUATOR (MISCELLANEOUS) IMPLANT
SET HNDPC FAN SPRY TIP SCT (DISPOSABLE) ×1 IMPLANT
STAPLER VISISTAT 35W (STAPLE) ×2 IMPLANT
STEM FEM ACTIS HIGH SZ8 (Stem) ×2 IMPLANT
STRIP CLOSURE SKIN 1/2X4 (GAUZE/BANDAGES/DRESSINGS) IMPLANT
SUT ETHIBOND NAB CT1 #1 30IN (SUTURE) ×2 IMPLANT
SUT ETHILON 2 0 PS N (SUTURE) IMPLANT
SUT MNCRL AB 4-0 PS2 18 (SUTURE) IMPLANT
SUT VIC AB 0 CT1 36 (SUTURE) ×2 IMPLANT
SUT VIC AB 1 CT1 36 (SUTURE) ×2 IMPLANT
SUT VIC AB 2-0 CT1 27 (SUTURE) ×4
SUT VIC AB 2-0 CT1 TAPERPNT 27 (SUTURE) ×2 IMPLANT
TRAY FOLEY MTR SLVR 16FR STAT (SET/KITS/TRAYS/PACK) IMPLANT

## 2020-08-24 NOTE — Op Note (Signed)
Gregory Howard, Gregory Howard MEDICAL RECORD NO: [id number] ACCOUNT NO: [id number] DATE OF BIRTH: 02/15/1960 FACILITY: [facility]ATION: [facility] PHYSICIAN: Vanita Panda. Magnus Ivan, MD  Operative Report   DATE OF PROCEDURE: 08/24/2020  PREOPERATIVE DIAGNOSIS:  Primary osteoarthritis and degenerative joint disease, left hip.  POSTOPERATIVE DIAGNOSIS:  Primary osteoarthritis and degenerative joint disease, left hip.  PROCEDURE:  Left total hip arthroplasty through direct anterior approach.  IMPLANTS:  DePuy sector Gription acetabular component size 54, size 36+0 neutral polyethylene liner, size 8 ACTIS femoral component with high offset, size 36+1.5 ceramic hip ball.  SURGEON:  Vanita Panda. Magnus Ivan, MD  ASSISTANT:  Rexene Edison, PA-C  ANESTHESIA:  Spinal.  BLOOD LOSS:  200 mL.  ANTIBIOTICS:  2 g IV Ancef.  COMPLICATIONS:  None.  INDICATIONS:  The patient is a 60 year old gentleman, well known to me.  He has well documented severe osteoarthritis and degenerative joint disease of his left hip.  He has tried and failed multiple steroid injections in left hip, and at this point, his  left hip pain has become daily and it is detrimentally affecting his mobility, his quality of life, and activities of daily living to the point he does wish to proceed with total hip arthroplasty on the left side and we agreed with this.  We have  recommended this as well.  We had a long and thorough discussion about the risk of acute blood loss anemia, nerve or vessel injury, fracture, infection, dislocation, DVT, implant failure, and skin and soft tissue issues.  We talked about our goals being  decreased pain, improved mobility and overall improved quality of life.  DESCRIPTION OF PROCEDURE:  After informed consent was obtained, appropriate left hip was marked. He was brought to the operating room, sat up on the stretcher where spinal anesthesia was obtained. He was laid in supine position on the stretcher.  I   assessed his leg length and placed traction boots on both his feet, and a Foley catheter was placed as well.  Next, he was placed supine on the Hana fracture table, the perineal post in place and both legs in line skeletal traction device and no traction  applied.  His left operative hip was prepped and draped with DuraPrep and sterile drapes.  A timeout was called. He was identified as correct patient, correct left hip.  I then made an incision just inferior and posterior to the anterior superior iliac  spine and carried this obliquely down the leg.  We dissected down tensor fascia lata muscle.  The tensor fascia was then divided longitudinally to proceed with direct anterior approach to the hip.  We identified and cauterized circumflex vessels, then  identified the hip capsule in L-type format and opened this up finding a large joint effusion.  We placed curved retractors around the medial and lateral femoral neck and made a femoral neck cut with oscillating saw and completed this with an osteotome.   We placed a corkscrew guide in the femoral head and removed the femoral head in its entirety and found a wide area with cartilage irregularity. I placed a bent Hohmann over the medial acetabular rim and removed remnants of the acetabular labrum and  other debris.  I then began reaming under direct visualization from a size 43 reamer in a stepwise increments going up to size 53 with all reamers placed under direct visualization. The last reamer was placed under direct fluoroscopy, so we could obtain  our depth of reaming, our inclination and anteversion.  I then  placed the real DePuy sector Gription acetabular component size 54 and a 36+0 neutral polyethylene liner for that size acetabular component.  Attention was then turned to the femur. With the  leg externally rotated to 120 degrees, extended, and adducted, we were able to place a Mueller retractor medially and a Hohmann retractor around the greater  trochanter.  I released lateral joint capsule and used a box cutting osteotome to enter the  femoral canal and a rongeur to lateralize.  I then began broaching using the ACTIS broaching system from a size 0 going all the way up to a size 8.  With size 8 in place, we trialed standard offset femoral neck and a 36+1.5 head ball, reduced this in the  acetabulum, and based on radiographic and clinical assessment, we needed more offset, but not more leg length.  We dislocated the hip, removed the trial components.  We placed the real high offset femoral component from DePuy, which was ACTIS size 8  with a 36+1.5 ceramic hip ball and again reduced this in acetabulum.  At this time, we were pleased with the radiographic assessment assessing leg length and offset and we were pleased with mechanical assessment as well.  We then irrigated the soft  tissue with normal saline solution using pulsatile lavage.  We closed the joint capsule with interrupted #1 Ethibond suture followed by #1 Vicryl to close the tensor fascia.  0 Vicryl was used to close deep tissue and 2-0 Vicryl was used to close  subcutaneous tissue.  The skin was reapproximated with staples.  An Aquacel dressing was applied.  He was taken off the Hana table and taken to recovery room in stable condition with all final counts being correct.  No complications noted.   Of note, Rexene Edison, PA-C, assisted during the entire case.  His assistance was crucial for facilitating all aspects of this case.   ROH D: 08/24/2020 1:31:43 pm T: 08/24/2020 10:18:00 pm  JOB: [id number]/ [id number]

## 2020-08-24 NOTE — Interval H&P Note (Signed)
History and Physical Interval Note: The patient Understands that he is here today for a left total hip replacement to treat his left hip osteoarthritis.  There has been no interval or acute change in his medical status.  Please see recent H&P.  The risks and benefits of surgery been explained in detail and informed consent is obtained.  The left hip has been marked.  08/24/2020 10:54 AM  Gregory Howard  has presented today for surgery, with the diagnosis of osteoarthritis left hip.  The various methods of treatment have been discussed with the patient and family. After consideration of risks, benefits and other options for treatment, the patient has consented to  Procedure(s): LEFT TOTAL HIP ARTHROPLASTY ANTERIOR APPROACH (Left) as a surgical intervention.  The patient's history has been reviewed, patient examined, no change in status, stable for surgery.  I have reviewed the patient's chart and labs.  Questions were answered to the patient's satisfaction.     [name]

## 2020-08-24 NOTE — Anesthesia Postprocedure Evaluation (Signed)
Anesthesia Post Note  Patient: Gregory Howard  Procedure(s) Performed: LEFT TOTAL HIP ARTHROPLASTY ANTERIOR APPROACH (Left: Hip)     Patient location during evaluation: PACU Anesthesia Type: MAC and Spinal Level of consciousness: oriented and awake and alert Pain management: pain level controlled Vital Signs Assessment: post-procedure vital signs reviewed and stable Respiratory status: spontaneous breathing, respiratory function stable and patient connected to nasal cannula oxygen Cardiovascular status: blood pressure returned to baseline and stable Postop Assessment: no headache, no backache and no apparent nausea or vomiting Anesthetic complications: no   No notable events documented.  Last Vitals:  Vitals:   08/24/20 1347 08/24/20 1400  BP: 113/63 121/75  Pulse: 67 63  Resp: 20 10  Temp: 37.1 C   SpO2: 98% 97%    Last Pain:  Vitals:   08/24/20 1347  TempSrc:   PainSc: 0-No pain                 Sharae Zappulla

## 2020-08-24 NOTE — Transfer of Care (Signed)
Immediate Anesthesia Transfer of Care Note  Patient: Gregory Howard  Procedure(s) Performed: LEFT TOTAL HIP ARTHROPLASTY ANTERIOR APPROACH (Left: Hip)  Patient Location: PACU  Anesthesia Type:Spinal  Level of Consciousness: awake, alert  and oriented  Airway & Oxygen Therapy: Patient Spontanous Breathing and Patient connected to face mask oxygen  Post-op Assessment: Report given to RN and Post -op Vital signs reviewed and stable  Post vital signs: Reviewed and stable  Last Vitals:  Vitals Value Taken Time  BP 113/63 08/24/20 1347  Temp    Pulse 67 08/24/20 1348  Resp 19 08/24/20 1348  SpO2 98 % 08/24/20 1348  Vitals shown include unvalidated device data.  Last Pain:  Vitals:   08/24/20 1015  TempSrc:   PainSc: 2       Patients Stated Pain Goal: 2 (08/24/20 1015)  Complications: No notable events documented.

## 2020-08-24 NOTE — Brief Op Note (Signed)
08/24/2020  1:34 PM  PATIENT:  Gregory Howard  60 y.o. male  PRE-OPERATIVE DIAGNOSIS:  osteoarthritis left hip  POST-OPERATIVE DIAGNOSIS:  osteoarthritis left hip  PROCEDURE:  Procedure(s): LEFT TOTAL HIP ARTHROPLASTY ANTERIOR APPROACH (Left)  SURGEON:  Surgeon(s) and Role:    [name], MD - Primary  PHYSICIAN ASSISTANT:  Rexene Edison, PA-C  ANESTHESIA:   spinal  EBL:  250 mL   COUNTS:  YES  DICTATION: .Other Dictation: Dictation Number [id number]  PLAN OF CARE: Admit for overnight observation  PATIENT DISPOSITION:  PACU - hemodynamically stable.   Delay start of Pharmacological VTE agent (>24hrs) due to surgical blood loss or risk of bleeding: no

## 2020-08-24 NOTE — Anesthesia Preprocedure Evaluation (Addendum)
Anesthesia Evaluation  Patient identified by MRN, date of birth, ID band Patient awake    Reviewed: Allergy & Precautions, H&P , NPO status , Patient's Chart, lab work & pertinent test results, reviewed documented beta blocker date and time   Airway Mallampati: I  TM Distance: >3 FB Neck ROM: full    Dental no notable dental hx. (+) Teeth Intact, Dental Advisory Given   Pulmonary former smoker,    Pulmonary exam normal breath sounds clear to auscultation       Cardiovascular Exercise Tolerance: Good hypertension,  Rhythm:regular Rate:Normal     Neuro/Psych negative neurological ROS  negative psych ROS   GI/Hepatic negative GI ROS, Neg liver ROS,   Endo/Other  diabetes  Renal/GU Renal disease  negative genitourinary   Musculoskeletal  (+) Arthritis , Osteoarthritis,    Abdominal   Peds  Hematology negative hematology ROS (+)   Anesthesia Other Findings   Reproductive/Obstetrics negative OB ROS                            Anesthesia Physical Anesthesia Plan  ASA: 2  Anesthesia Plan: MAC and Spinal   Post-op Pain Management:    Induction:   PONV Risk Score and Plan: 2 and Treatment may vary due to age or medical condition and Propofol infusion  Airway Management Planned: Natural Airway and Nasal Cannula  Additional Equipment: None  Intra-op Plan:   Post-operative Plan:   Informed Consent: I have reviewed the patients History and Physical, chart, labs and discussed the procedure including the risks, benefits and alternatives for the proposed anesthesia with the patient or authorized representative who has indicated his/her understanding and acceptance.     Dental Advisory Given  Plan Discussed with: CRNA and Anesthesiologist  Anesthesia Plan Comments: (  )       Anesthesia Quick Evaluation

## 2020-08-24 NOTE — Evaluation (Signed)
Physical Therapy Evaluation Patient Details Name: Gregory Howard MRN: [id number] DOB: 07-12-1960 Today's Date: 08/24/2020   History of Present Illness  Patient is 60 y.o. male s/p Lt THA anterior approach on 08/24/20 with PMH significant for OA, HTN.    Clinical Impression  Gregory Howard is a 60 y.o. male POD 0 s/p Lt THA. Patient reports independence with mobility at baseline. Patient is now limited by functional impairments (see PT problem list below) and requires min assist for transfers and gait with RW. Patient was able to ambulate ~70 feet with RW and min assist. Patient instructed in exercise to facilitate strength and circulation to manage edema and reduce risk of DVT. Patient will benefit from continued skilled PT interventions to address impairments and progress towards PLOF. Acute PT will follow to progress mobility and stair training in preparation for safe discharge home.     Follow Up Recommendations Home health PT;Follow surgeon's recommendation for DC plan and follow-up therapies    Equipment Recommendations  Rolling walker with 5" wheels;3in1 (PT)    Recommendations for Other Services       Precautions / Restrictions Precautions Precautions: Fall Restrictions Weight Bearing Restrictions: No (Simultaneous filing. User may not have seen previous data.) Other Position/Activity Restrictions: WBAT      Mobility  Bed Mobility Overal bed mobility: Needs Assistance Bed Mobility: Supine to Sit     Supine to sit: Min assist;HOB elevated     General bed mobility comments: cues to use bed rail to raise trunk and pivot hips, min assist to bring Lt LE off EOB.    Transfers Overall transfer level: Needs assistance Equipment used: Rolling walker (2 wheeled) Transfers: Sit to/from Stand Sit to Stand: Min assist         General transfer comment: cues for hand placement with power up, pt initiated rise but required min assist to complete and steady in  standing.  Ambulation/Gait Ambulation/Gait assistance: Min assist Gait Distance (Feet): 70 Feet Assistive device: Rolling walker (2 wheeled) Gait Pattern/deviations: Step-to pattern;Step-through pattern;Decreased stride length;Narrow base of support;Decreased weight shift to left Gait velocity: decr   General Gait Details: cues for step to pattern and proximity to RW. assist to manage walker position as pt progressed to step through pattern. slightly NBOS and increased antalgia towards end of gait.  Stairs            Wheelchair Mobility    Modified Rankin (Stroke Patients Only)       Balance Overall balance assessment: Needs assistance Sitting-balance support: Feet supported Sitting balance-Leahy Scale: Good     Standing balance support: During functional activity;Bilateral upper extremity supported Standing balance-Leahy Scale: Poor                               Pertinent Vitals/Pain Pain Assessment: 0-10 Pain Score: 6  Pain Location: Lt hip Pain Descriptors / Indicators: Aching;Burning;Tightness Pain Intervention(s): Limited activity within patient's tolerance;Monitored during session;Repositioned;Premedicated before session;Ice applied    Home Living Family/patient expects to be discharged to:: Private residence Living Arrangements: Spouse/significant other Available Help at Discharge: Family Type of Home: House Home Access: Stairs to enter Entrance Stairs-Rails: None Entrance Stairs-Number of Steps: 3 Home Layout: One level Home Equipment: None      Prior Function Level of Independence: Independent               Hand Dominance   Dominant Hand: Right    Extremity/Trunk Assessment  Upper Extremity Assessment Upper Extremity Assessment: Overall WFL for tasks assessed    Lower Extremity Assessment Lower Extremity Assessment: Overall WFL for tasks assessed    Cervical / Trunk Assessment Cervical / Trunk Assessment: Normal   Communication   Communication: No difficulties  Cognition Arousal/Alertness: Awake/alert Behavior During Therapy: WFL for tasks assessed/performed Overall Cognitive Status: Within Functional Limits for tasks assessed                                        General Comments      Exercises Total Joint Exercises Ankle Circles/Pumps: AROM;Both;20 reps;Seated Quad Sets: AROM;Left;10 reps;Seated   Assessment/Plan    PT Assessment Patient needs continued PT services  PT Problem List Decreased strength;Decreased range of motion;Decreased balance;Decreased mobility;Decreased activity tolerance;Decreased knowledge of use of DME;Decreased knowledge of precautions       PT Treatment Interventions DME instruction;Gait training;Stair training;Functional mobility training;Therapeutic activities;Therapeutic exercise;Balance training;Patient/family education    PT Goals (Current goals can be found in the Care Plan section)  Acute Rehab PT Goals Patient Stated Goal: get back to walking PT Goal Formulation: With patient Time For Goal Achievement: 08/31/20 Potential to Achieve Goals: Good    Frequency 7X/week   Barriers to discharge        Co-evaluation               AM-PAC PT "6 Clicks" Mobility  Outcome Measure Help needed turning from your back to your side while in a flat bed without using bedrails?: None Help needed moving from lying on your back to sitting on the side of a flat bed without using bedrails?: A Little Help needed moving to and from a bed to a chair (including a wheelchair)?: A Little Help needed standing up from a chair using your arms (e.g., wheelchair or bedside chair)?: A Little Help needed to walk in hospital room?: A Little Help needed climbing 3-5 steps with a railing? : A Lot 6 Click Score: 18    End of Session Equipment Utilized During Treatment: Gait belt Activity Tolerance: Patient tolerated treatment well Patient left: in  chair;with call bell/phone within reach;with chair alarm set Nurse Communication: Mobility status PT Visit Diagnosis: Muscle weakness (generalized) (M62.81);Difficulty in walking, not elsewhere classified (R26.2)    Time: 7169-6789 PT Time Calculation (min) (ACUTE ONLY): 26 min   Charges:   PT Evaluation $PT Eval Low Complexity: 1 Low PT Treatments $Gait Training: 8-22 mins        Wynn Maudlin, DPT Acute Rehabilitation Services Office [phone] Pager [phone]   Anitra Lauth 08/24/2020, 6:22 PM

## 2020-08-24 NOTE — Anesthesia Procedure Notes (Signed)
Spinal  Patient location during procedure: OR Start time: 08/24/2020 12:15 PM End time: 08/24/2020 12:21 PM Reason for block: surgical anesthesia Staffing Anesthesiologist: Bethena Midget, MD Preanesthetic Checklist Completed: patient identified, IV checked, site marked, risks and benefits discussed, surgical consent, monitors and equipment checked, pre-op evaluation and timeout performed Spinal Block Patient position: sitting Prep: DuraPrep Patient monitoring: heart rate, cardiac monitor, continuous pulse ox and blood pressure Approach: midline Location: L3-4 Injection technique: single-shot Needle Needle type: Sprotte  Needle gauge: 24 G Needle length: 9 cm Assessment Sensory level: T4 Events: CSF return

## 2020-08-25 DIAGNOSIS — M1612 Unilateral primary osteoarthritis, left hip: Secondary | ICD-10-CM | POA: Diagnosis not present

## 2020-08-25 LAB — CBC
HCT: 37.3 % — ABNORMAL LOW (ref 39.0–52.0)
Hemoglobin: 13.1 g/dL (ref 13.0–17.0)
MCH: 31 pg (ref 26.0–34.0)
MCHC: 35.1 g/dL (ref 30.0–36.0)
MCV: 88.4 fL (ref 80.0–100.0)
Platelets: 249 10*3/uL (ref 150–400)
RBC: 4.22 MIL/uL (ref 4.22–5.81)
RDW: 12.4 % (ref 11.5–15.5)
WBC: 14 10*3/uL — ABNORMAL HIGH (ref 4.0–10.5)
nRBC: 0 % (ref 0.0–0.2)

## 2020-08-25 LAB — BASIC METABOLIC PANEL
Anion gap: 7 (ref 5–15)
BUN: 21 mg/dL — ABNORMAL HIGH (ref 6–20)
CO2: 25 mmol/L (ref 22–32)
Calcium: 8.8 mg/dL — ABNORMAL LOW (ref 8.9–10.3)
Chloride: 103 mmol/L (ref 98–111)
Creatinine, Ser: 0.87 mg/dL (ref 0.61–1.24)
GFR, Estimated: >60 mL/min (ref >60)
Glucose, Bld: 165 mg/dL — ABNORMAL HIGH (ref 70–99)
Potassium: 4.5 mmol/L (ref 3.5–5.1)
Sodium: 135 mmol/L (ref 135–145)

## 2020-08-25 MED ORDER — OXYCODONE HCL 5 MG PO TABS: 5.0000 mg | ORAL_TABLET | Freq: Four times a day (QID) | ORAL | 0 refills | Status: DC | PRN

## 2020-08-25 MED ORDER — ONDANSETRON HCL 4 MG PO TABS: 4.0000 mg | ORAL_TABLET | Freq: Three times a day (TID) | ORAL | 0 refills | Status: DC | PRN

## 2020-08-25 MED ORDER — ASPIRIN 81 MG PO CHEW
81.0000 mg | CHEWABLE_TABLET | Freq: Two times a day (BID) | ORAL | 0 refills | Status: DC
Start: 2020-08-25 — End: 2022-07-28

## 2020-08-25 MED ORDER — METHOCARBAMOL 500 MG PO TABS: 500.0000 mg | ORAL_TABLET | Freq: Three times a day (TID) | ORAL | 0 refills | Status: DC | PRN

## 2020-08-25 NOTE — TOC Transition Note (Signed)
Transition of Care [facility]) - CM/SW Discharge Note   Patient Details  Name: Gregory Howard MRN: [id number] Date of Birth: 07/18/1960  [facility] CM/SW Contact:  Ida Rogue, LCSW Phone Number: 08/25/2020, 1:49 PM   Clinical Narrative:  Patient who is stable for d/c today is in need of DME equipment, HH PT.  Equipment delivered from [place].  Denyse Amass with [facility] agrees to provide [facility] PT services.  No further needs identified.  TOC sign off.     Final next level of care: Home w Home Health Services Barriers to Discharge: No Barriers Identified   Patient Goals and CMS Choice        Discharge Placement                       Discharge Plan and Services                                     Social Determinants of Health (SDOH) Interventions     Readmission Risk Interventions No flowsheet data found.

## 2020-08-25 NOTE — Plan of Care (Signed)
  Problem: Education: Goal: Knowledge of the prescribed therapeutic regimen will improve 08/25/2020 1428 by Olen Cordial, RN Outcome: Completed/Met 08/25/2020 1427 by Olen Cordial, RN Outcome: Progressing 08/25/2020 0721 by Olen Cordial, RN Outcome: Progressing Goal: Understanding of discharge needs will improve 08/25/2020 1428 by Olen Cordial, RN Outcome: Completed/Met 08/25/2020 1427 by Olen Cordial, RN Outcome: Progressing 08/25/2020 0721 by Olen Cordial, RN Outcome: Progressing Goal: Individualized Educational Video(s) 08/25/2020 1428 by Olen Cordial, RN Outcome: Completed/Met 08/25/2020 1427 by Olen Cordial, RN Outcome: Progressing 08/25/2020 0721 by Olen Cordial, RN Outcome: Progressing

## 2020-08-25 NOTE — Discharge Instructions (Signed)

## 2020-08-25 NOTE — Progress Notes (Signed)
Physical Therapy Treatment Patient Details Name: Gregory Howard MRN: [id number] DOB: 05/29/1960 Today's Date: 08/25/2020    History of Present Illness Patient is 60 y.o. male s/p Lt THA anterior approach on 08/24/20 with PMH significant for OA, HTN.    PT Comments    Pt ambulated in hallway and performed standing exercises.  Pt reviewed and provided with HEP.  Spouse educated on safe stair technique and assist with therapist demonstrating.  Pt and spouse had no further questions, and pt ready to d/c home today.    Follow Up Recommendations  Home health PT;Follow surgeon's recommendation for DC plan and follow-up therapies     Equipment Recommendations  Rolling walker with 5" wheels;3in1 (PT)    Recommendations for Other Services       Precautions / Restrictions Precautions Precautions: Fall Restrictions Other Position/Activity Restrictions: WBAT    Mobility  Bed Mobility Overal bed mobility: Needs Assistance Bed Mobility: Sit to Supine     Supine to sit: HOB elevated;Min guard Sit to supine: Supervision   General bed mobility comments: cues for self assist of L LE    Transfers Overall transfer level: Needs assistance Equipment used: Rolling walker (2 wheeled) Transfers: Sit to/from Stand Sit to Stand: Min guard         General transfer comment: verbal cues for UE and LE positioning  Ambulation/Gait Ambulation/Gait assistance: Min guard Gait Distance (Feet): 120 Feet Assistive device: Rolling walker (2 wheeled) Gait Pattern/deviations: Step-to pattern;Decreased stance time - left;Antalgic Gait velocity: decr   General Gait Details: verbal cues for sequence, RW positioning, step length, posture   Stairs  Stair Management: Step to pattern;Backwards;With walker Number of Stairs: 2 General stair comments: spouse educated on stair technique and holding walker with therapist demonstrating as pt   Wheelchair Mobility    Modified Rankin (Stroke Patients  Only)       Balance                                            Cognition Arousal/Alertness: Awake/alert Behavior During Therapy: WFL for tasks assessed/performed Overall Cognitive Status: Within Functional Limits for tasks assessed                                        Exercises Total Joint Exercises Hip ABduction/ADduction: AROM;Left;10 reps;Standing Knee Flexion: AROM;Left;10 reps;Standing Marching in Standing: AROM;Left;10 reps;Standing Standing Hip Extension: AROM;Left;10 reps;Standing Other Exercises Other Exercises: pt demonstrated supine and sitting exercises however did not perform full 10 repetitions so save energy for d/c home    General Comments        Pertinent Vitals/Pain Pain Assessment: 0-10 Pain Score: 3  Pain Location: Lt hip Pain Descriptors / Indicators: Aching;Sore Pain Intervention(s): Premedicated before session;Repositioned;Monitored during session    Home Living                      Prior Function            PT Goals (current goals can now be found in the care plan section) Progress towards PT goals: Progressing toward goals    Frequency    7X/week      PT Plan Current plan remains appropriate    Co-evaluation  AM-PAC PT "6 Clicks" Mobility   Outcome Measure  Help needed turning from your back to your side while in a flat bed without using bedrails?: None Help needed moving from lying on your back to sitting on the side of a flat bed without using bedrails?: A Little Help needed moving to and from a bed to a chair (including a wheelchair)?: A Little Help needed standing up from a chair using your arms (e.g., wheelchair or bedside chair)?: A Little Help needed to walk in hospital room?: A Little Help needed climbing 3-5 steps with a railing? : A Little 6 Click Score: 19    End of Session Equipment Utilized During Treatment: Gait belt Activity Tolerance: Patient  tolerated treatment well Patient left: with call bell/phone within reach;in bed;with family/visitor present;with nursing/sitter in room Nurse Communication: Mobility status PT Visit Diagnosis: Muscle weakness (generalized) (M62.81);Difficulty in walking, not elsewhere classified (R26.2)     Time: 4888-9169 PT Time Calculation (min) (ACUTE ONLY): 32 min  Charges:  $Gait Training: 8-22 mins $Therapeutic Exercise: 8-22 mins                     Thomasene Mohair PT, DPT Acute Rehabilitation Services Pager: [phone] Office: [phone]  Maida Sale E 08/25/2020, 4:04 PM

## 2020-08-25 NOTE — Plan of Care (Signed)
  Problem: Education: Goal: Knowledge of the prescribed therapeutic regimen will improve Outcome: Progressing   Problem: Activity: Goal: Ability to avoid complications of mobility impairment will improve Outcome: Progressing   Problem: Pain Management: Goal: Pain level will decrease with appropriate interventions Outcome: Progressing   

## 2020-08-25 NOTE — Progress Notes (Signed)
Physical Therapy Treatment Patient Details Name: Gregory Howard MRN: [id number] DOB: 1960/06/19 Today's Date: 08/25/2020    History of Present Illness Patient is 60 y.o. male s/p Lt THA anterior approach on 08/24/20 with PMH significant for OA, HTN.    PT Comments    Pt reports nausea earlier however feeling a little better.  Pt ambulated in hallway and practiced safe stair technique.  Will return to perform exercises and ambulate again prior to d/c home.    Follow Up Recommendations  Home health PT;Follow surgeon's recommendation for DC plan and follow-up therapies     Equipment Recommendations  Rolling walker with 5" wheels;3in1 (PT)    Recommendations for Other Services       Precautions / Restrictions Precautions Precautions: Fall Restrictions Other Position/Activity Restrictions: WBAT    Mobility  Bed Mobility Overal bed mobility: Needs Assistance Bed Mobility: Supine to Sit     Supine to sit: HOB elevated;Min guard     General bed mobility comments: cues for self assist of L LE over EOB    Transfers Overall transfer level: Needs assistance Equipment used: Rolling walker (2 wheeled) Transfers: Sit to/from Stand Sit to Stand: Min guard         General transfer comment: verbal cues for UE and LE positioning  Ambulation/Gait Ambulation/Gait assistance: Min guard Gait Distance (Feet): 80 Feet Assistive device: Rolling walker (2 wheeled) Gait Pattern/deviations: Step-to pattern;Decreased stance time - left;Antalgic Gait velocity: decr   General Gait Details: verbal cues for sequence, RW positioning, step length, posture   Stairs Stairs: Yes Stairs assistance: Min guard Stair Management: Step to pattern;Backwards;With walker Number of Stairs: 2 General stair comments: verbal cues for safety, sequence, RW positioning; pt performed twice   Wheelchair Mobility    Modified Rankin (Stroke Patients Only)       Balance                                             Cognition Arousal/Alertness: Awake/alert Behavior During Therapy: WFL for tasks assessed/performed Overall Cognitive Status: Within Functional Limits for tasks assessed                                        Exercises      General Comments        Pertinent Vitals/Pain Pain Assessment: 0-10 Pain Score: 3  Pain Location: Lt hip Pain Descriptors / Indicators: Aching;Sore Pain Intervention(s): Repositioned;Monitored during session;Premedicated before session    Home Living                      Prior Function            PT Goals (current goals can now be found in the care plan section) Progress towards PT goals: Progressing toward goals    Frequency    7X/week      PT Plan Current plan remains appropriate    Co-evaluation              AM-PAC PT "6 Clicks" Mobility   Outcome Measure  Help needed turning from your back to your side while in a flat bed without using bedrails?: None Help needed moving from lying on your back to sitting on the side of a flat bed without using bedrails?: A  Little Help needed moving to and from a bed to a chair (including a wheelchair)?: A Little Help needed standing up from a chair using your arms (e.g., wheelchair or bedside chair)?: A Little Help needed to walk in hospital room?: A Little Help needed climbing 3-5 steps with a railing? : A Little 6 Click Score: 19    End of Session Equipment Utilized During Treatment: Gait belt Activity Tolerance: Patient tolerated treatment well Patient left: in chair;with call bell/phone within reach;with chair alarm set Nurse Communication: Mobility status PT Visit Diagnosis: Muscle weakness (generalized) (M62.81);Difficulty in walking, not elsewhere classified (R26.2)     Time: 8295-6213 PT Time Calculation (min) (ACUTE ONLY): 19 min  Charges:  $Gait Training: 8-22 mins                     [name], DPT Acute  Rehabilitation Services Pager: [phone] Office: [phone]    [name] 08/25/2020, 12:33 PM

## 2020-08-25 NOTE — Progress Notes (Signed)
Subjective: 1 Day Post-Op Procedure(s) (LRB): LEFT TOTAL HIP ARTHROPLASTY ANTERIOR APPROACH (Left) Patient reports pain as moderate.    Objective: Vital signs in last 24 hours: Temp:  [97.5 F (36.4 C)-98.8 F (37.1 C)] 97.8 F (36.6 C) (07/23 0500) Pulse Rate:  [48-86] 86 (07/23 0500) Resp:  [10-20] 20 (07/23 0500) BP: (113-164)/(63-91) 137/71 (07/23 0500) SpO2:  [95 %-100 %] 99 % (07/23 0500) Weight:  [100 kg] 100 kg (07/22 0957)  Intake/Output from previous day: 07/22 0701 - 07/23 0700 In: 2935.9 [P.O.:920; I.V.:2015.9] Out: 2550 [Urine:2300; Blood:250] Intake/Output this shift: Total I/O In: -  Out: 25 [Urine:25]  Recent Labs    08/22/20 1332 08/25/20 0218  HGB 14.4 13.1   Recent Labs    08/22/20 1332 08/25/20 0218  WBC 6.2 14.0*  RBC 4.73 4.22  HCT 42.4 37.3*  PLT 282 249   Recent Labs    08/22/20 1332 08/25/20 0218  NA 139 135  K 4.9 4.5  CL 102 103  CO2 29 25  BUN 17 21*  CREATININE 0.94 0.87  GLUCOSE 150* 165*  CALCIUM 9.5 8.8*   No results for input(s): LABPT, INR in the last 72 hours.  Sensation intact distally Intact pulses distally Dorsiflexion/Plantar flexion intact Incision: scant drainage   Assessment/Plan: 1 Day Post-Op Procedure(s) (LRB): LEFT TOTAL HIP ARTHROPLASTY ANTERIOR APPROACH (Left) Up with therapy Discharge home with home health      [name] 08/25/2020, 8:53 AM

## 2020-08-25 NOTE — Plan of Care (Signed)
  Problem: Pain Management: Goal: Pain level will decrease with appropriate interventions Outcome: Progressing   Problem: Safety: Goal: Ability to remain free from injury will improve Outcome: Progressing   Problem: Coping: Goal: Level of anxiety will decrease Outcome: Progressing   

## 2020-08-26 NOTE — Discharge Summary (Signed)
Patient ID: Gregory Howard MRN: [id number] DOB/AGE: 1960/04/07 60 y.o.  Admit date: 08/24/2020 Discharge date: 08/26/2020  Admission Diagnoses:  Principal Problem:   Unilateral primary osteoarthritis, left hip Active Problems:   Status post total replacement of left hip   Discharge Diagnoses:  Same  Past Medical History:  Diagnosis Date   Arthritis    History of kidney stones    Hypertension    Kidney stones    Pre-diabetes     Surgeries: Procedure(s): LEFT TOTAL HIP ARTHROPLASTY ANTERIOR APPROACH on 08/24/2020   Consultants:   Discharged Condition: Improved  Hospital Course: Gregory Howard is an 60 y.o. male who was admitted 08/24/2020 for operative treatment ofUnilateral primary osteoarthritis, left hip. Patient has severe unremitting pain that affects sleep, daily activities, and work/hobbies. After pre-op clearance the patient was taken to the operating room on 08/24/2020 and underwent  Procedure(s): LEFT TOTAL HIP ARTHROPLASTY ANTERIOR APPROACH.    Patient was given perioperative antibiotics:  Anti-infectives (From admission, onward)    Start     Dose/Rate Route Frequency Ordered Stop   08/24/20 1800  ceFAZolin (ANCEF) IVPB 1 g/50 mL premix        1 g 100 mL/hr over 30 Minutes Intravenous Every 6 hours 08/24/20 1551 08/24/20 2336   08/24/20 0945  ceFAZolin (ANCEF) 2 g in sodium chloride 0.9 % 100 mL IVPB        2 g 200 mL/hr over 30 Minutes Intravenous On call to O.R. 08/24/20 2979 08/24/20 1222        Patient was given sequential compression devices, early ambulation, and chemoprophylaxis to prevent DVT.  Patient benefited maximally from hospital stay and there were no complications.    Recent vital signs: Patient Vitals for the past 24 hrs:  BP Temp Temp src Pulse Resp SpO2  08/25/20 1000 130/72 97.7 F (36.5 C) Oral (!) 58 14 100 %     Recent laboratory studies:  Recent Labs    08/25/20 0218  WBC 14.0*  HGB 13.1  HCT 37.3*  PLT 249  NA 135  K 4.5   CL 103  CO2 25  BUN 21*  CREATININE 0.87  GLUCOSE 165*  CALCIUM 8.8*     Discharge Medications:   Allergies as of 08/25/2020   No Known Allergies      Medication List     TAKE these medications    acetaminophen 500 MG tablet Commonly known as: TYLENOL Take 500 mg by mouth every 6 (six) hours as needed (pain).   amLODipine 5 MG tablet Commonly known as: NORVASC Take 5 mg by mouth in the morning.   aspirin 81 MG chewable tablet Chew 1 tablet (81 mg total) by mouth 2 (two) times daily.   Fish Oil 1200 MG Caps Take 2,400 mg by mouth in the morning.   methocarbamol 500 MG tablet Commonly known as: ROBAXIN Take 1 tablet (500 mg total) by mouth every 8 (eight) hours as needed for muscle spasms.   ondansetron 4 MG tablet Commonly known as: ZOFRAN Take 1 tablet (4 mg total) by mouth every 8 (eight) hours as needed for nausea.   oxyCODONE 5 MG immediate release tablet Commonly known as: Oxy IR/ROXICODONE Take 1-2 tablets (5-10 mg total) by mouth every 6 (six) hours as needed for moderate pain (pain score 4-6).   quinapril 40 MG tablet Commonly known as: ACCUPRIL Take 40 mg by mouth in the morning.   Turmeric 500 MG Tabs Take by mouth.   vitamin B-12 500 MCG  tablet Commonly known as: CYANOCOBALAMIN Take 500 mcg by mouth in the morning.   Vitamin D3 125 MCG (5000 UT) Tabs Take 5,000 Units by mouth every evening.       ASK your doctor about these medications    tamsulosin 0.4 MG Caps capsule Commonly known as: Flomax Take 1 capsule (0.4 mg total) by mouth daily.        Diagnostic Studies: DG Pelvis Portable  Result Date: 08/24/2020 CLINICAL DATA:  Post left hip replacement. EXAM: PORTABLE PELVIS 1-2 VIEWS COMPARISON:  None. FINDINGS: Left hip arthroplasty in expected alignment. No periprosthetic lucency or fracture. Recent postsurgical change includes air and edema in the soft tissues with lateral skin staples. IMPRESSION: Left hip arthroplasty without  immediate postoperative complication. Electronically Signed   By: Narda Rutherford M.D.   On: 08/24/2020 15:04   DG C-Arm 1-60 Min-No Report  Result Date: 08/24/2020 Fluoroscopy was utilized by the requesting physician.  No radiographic interpretation.   DG HIP OPERATIVE UNILAT W OR W/O PELVIS LEFT  Result Date: 08/24/2020 CLINICAL DATA:  Left anterior total hip replacement. EXAM: OPERATIVE LEFT HIP (WITH PELVIS IF PERFORMED) 3 VIEWS TECHNIQUE: Fluoroscopic spot image(s) were submitted for interpretation post-operatively. FLUOROSCOPY TIME:  18 seconds (3 mGy) COMPARISON:  Left hip radiographs-05/25/2019 FINDINGS: Three spot intraoperative fluoroscopic images of the left hip and lower pelvis are provided for review. Images demonstrate sequela of left total hip replacement. Prosthetic hardware appears anatomic given AP projection. There is a minimal amount of subcutaneous emphysema about the operative site. No radiopaque foreign body. Limited visualization of the pelvis suggests moderate degenerative change of the contralateral right hip, incompletely evaluated. IMPRESSION: Post left total hip replacement without evidence of complication. Electronically Signed   By: Simonne Come M.D.   On: 08/24/2020 14:04    Disposition: Discharge disposition: 01-Home or Self Care          Follow-up Information     Gregory Hitch, MD Follow up in 2 week(s).   Specialty: Orthopedic Surgery Contact information: [address] [place] [address] [phone]         Care, [facility] Follow up.   Specialty: Home Health Services Why: This is the agency that will be providing physical therapy in the home Contact information: [address] [place] [address] [phone]                  Signed: Kathryne Howard 08/26/2020, 9:18 AM

## 2020-08-27 ENCOUNTER — Telehealth: Payer: Self-pay

## 2020-08-27 ENCOUNTER — Encounter (HOSPITAL_COMMUNITY): Payer: Self-pay | Admitting: Orthopaedic Surgery

## 2020-08-27 ENCOUNTER — Telehealth: Payer: Self-pay | Admitting: Orthopaedic Surgery

## 2020-08-27 NOTE — Telephone Encounter (Signed)
Pt is having issues with urination. Its odorless, dark and frequent. (100 to 200 ML) No bowel movement since before surgery. Pt stopped taking oxycodone and only takes tylenol. Pt has chronic tinglyness in pinky singers and toes.  Frequency is 3 times a week for 1 week and 2 times a week for 2 weeks   CB [phone]  Beyota jim hoffman PT

## 2020-08-27 NOTE — Telephone Encounter (Signed)
Patient aware of the below message  

## 2020-08-27 NOTE — Telephone Encounter (Signed)
Patient called stating that he has been urinating more frequently and not making it to the restroom at night since his surgery.  Patient had left hip surgery on Friday, 08/24/2020.  Would like to know if this is something normal?  Cb# [phone].  Please advise.

## 2020-08-28 NOTE — Discharge Summary (Signed)
Patient ID: Gregory Howard MRN: [id number] DOB/AGE: 06-Jan-1961 60 y.o.  Admit date: 08/24/2020 Discharge date: 08/28/2020  Admission Diagnoses:  Principal Problem:   Unilateral primary osteoarthritis, left hip Active Problems:   Status post total replacement of left hip   Discharge Diagnoses:  Same  Past Medical History:  Diagnosis Date   Arthritis    History of kidney stones    Hypertension    Kidney stones    Pre-diabetes     Surgeries: Procedure(s): LEFT TOTAL HIP ARTHROPLASTY ANTERIOR APPROACH on 08/24/2020   Consultants:   Discharged Condition: Improved  Hospital Course: Gregory Howard is an 60 y.o. male who was admitted 08/24/2020 for operative treatment ofUnilateral primary osteoarthritis, left hip. Patient has severe unremitting pain that affects sleep, daily activities, and work/hobbies. After pre-op clearance the patient was taken to the operating room on 08/24/2020 and underwent  Procedure(s): LEFT TOTAL HIP ARTHROPLASTY ANTERIOR APPROACH.    Patient was given perioperative antibiotics:  Anti-infectives (From admission, onward)    Start     Dose/Rate Route Frequency Ordered Stop   08/24/20 1800  ceFAZolin (ANCEF) IVPB 1 g/50 mL premix        1 g 100 mL/hr over 30 Minutes Intravenous Every 6 hours 08/24/20 1551 08/24/20 2336   08/24/20 0945  ceFAZolin (ANCEF) 2 g in sodium chloride 0.9 % 100 mL IVPB        2 g 200 mL/hr over 30 Minutes Intravenous On call to O.R. 08/24/20 3299 08/24/20 1222        Patient was given sequential compression devices, early ambulation, and chemoprophylaxis to prevent DVT.  Patient benefited maximally from hospital stay and there were no complications.    Recent vital signs: No data found.   Recent laboratory studies: No results for input(s): WBC, HGB, HCT, PLT, NA, K, CL, CO2, BUN, CREATININE, GLUCOSE, INR, CALCIUM in the last 72 hours.  Invalid input(s): PT, 2   Discharge Medications:   Allergies as of 08/25/2020   No  Known Allergies      Medication List     TAKE these medications    acetaminophen 500 MG tablet Commonly known as: TYLENOL Take 500 mg by mouth every 6 (six) hours as needed (pain).   amLODipine 5 MG tablet Commonly known as: NORVASC Take 5 mg by mouth in the morning.   aspirin 81 MG chewable tablet Chew 1 tablet (81 mg total) by mouth 2 (two) times daily.   Fish Oil 1200 MG Caps Take 2,400 mg by mouth in the morning.   methocarbamol 500 MG tablet Commonly known as: ROBAXIN Take 1 tablet (500 mg total) by mouth every 8 (eight) hours as needed for muscle spasms.   ondansetron 4 MG tablet Commonly known as: ZOFRAN Take 1 tablet (4 mg total) by mouth every 8 (eight) hours as needed for nausea.   oxyCODONE 5 MG immediate release tablet Commonly known as: Oxy IR/ROXICODONE Take 1-2 tablets (5-10 mg total) by mouth every 6 (six) hours as needed for moderate pain (pain score 4-6).   quinapril 40 MG tablet Commonly known as: ACCUPRIL Take 40 mg by mouth in the morning.   Turmeric 500 MG Tabs Take by mouth.   vitamin B-12 500 MCG tablet Commonly known as: CYANOCOBALAMIN Take 500 mcg by mouth in the morning.   Vitamin D3 125 MCG (5000 UT) Tabs Take 5,000 Units by mouth every evening.       ASK your doctor about these medications    tamsulosin 0.4 MG  Caps capsule Commonly known as: Flomax Take 1 capsule (0.4 mg total) by mouth daily.        Diagnostic Studies: DG Pelvis Portable  Result Date: 08/24/2020 CLINICAL DATA:  Post left hip replacement. EXAM: PORTABLE PELVIS 1-2 VIEWS COMPARISON:  None. FINDINGS: Left hip arthroplasty in expected alignment. No periprosthetic lucency or fracture. Recent postsurgical change includes air and edema in the soft tissues with lateral skin staples. IMPRESSION: Left hip arthroplasty without immediate postoperative complication. Electronically Signed   By: Narda Rutherford M.D.   On: 08/24/2020 15:04   DG C-Arm 1-60 Min-No  Report  Result Date: 08/24/2020 Fluoroscopy was utilized by the requesting physician.  No radiographic interpretation.   DG HIP OPERATIVE UNILAT W OR W/O PELVIS LEFT  Result Date: 08/24/2020 CLINICAL DATA:  Left anterior total hip replacement. EXAM: OPERATIVE LEFT HIP (WITH PELVIS IF PERFORMED) 3 VIEWS TECHNIQUE: Fluoroscopic spot image(s) were submitted for interpretation post-operatively. FLUOROSCOPY TIME:  18 seconds (3 mGy) COMPARISON:  Left hip radiographs-05/25/2019 FINDINGS: Three spot intraoperative fluoroscopic images of the left hip and lower pelvis are provided for review. Images demonstrate sequela of left total hip replacement. Prosthetic hardware appears anatomic given AP projection. There is a minimal amount of subcutaneous emphysema about the operative site. No radiopaque foreign body. Limited visualization of the pelvis suggests moderate degenerative change of the contralateral right hip, incompletely evaluated. IMPRESSION: Post left total hip replacement without evidence of complication. Electronically Signed   By: Simonne Come M.D.   On: 08/24/2020 14:04    Disposition: Discharge disposition: 01-Home or Self Care          Follow-up Information     [name], MD Follow up in 2 week(s).   Specialty: Orthopedic Surgery Contact information: [address] [place] [address] [phone]         Care, [facility] Follow up.   Specialty: Home Health Services Why: This is the agency that will be providing physical therapy in the home Contact information: [address] [place] [address] [phone]                  Signed: [name] 08/28/2020, 3:10 PM

## 2020-08-30 ENCOUNTER — Encounter (HOSPITAL_COMMUNITY): Payer: Self-pay | Admitting: Orthopaedic Surgery

## 2020-08-30 MED ORDER — SODIUM CHLORIDE 0.9 % IR SOLN
Status: AC | PRN
  Administered 2020-08-24: 1000 mL

## 2020-09-01 ENCOUNTER — Other Ambulatory Visit: Payer: Self-pay | Admitting: Orthopaedic Surgery

## 2020-09-06 ENCOUNTER — Telehealth: Payer: Self-pay | Admitting: Orthopaedic Surgery

## 2020-09-06 ENCOUNTER — Ambulatory Visit (INDEPENDENT_AMBULATORY_CARE_PROVIDER_SITE_OTHER): Payer: BC Managed Care – PPO | Admitting: Physician Assistant

## 2020-09-06 ENCOUNTER — Encounter: Payer: Self-pay | Admitting: Physician Assistant

## 2020-09-06 ENCOUNTER — Other Ambulatory Visit: Payer: Self-pay

## 2020-09-06 DIAGNOSIS — Z96642 Presence of left artificial hip joint: Secondary | ICD-10-CM

## 2020-09-06 NOTE — Progress Notes (Signed)
HPI: [name] returns today status post left total hip arthroplasty 08/22/2020.  Patient overall is doing well.  He is ambulating with a walker.  He feels like his range of motion and strength are improving.  He is on 81 aspirin twice daily.  He is only taking Robaxin as needed.  Physical exam: General well-developed well-nourished male no acute distress.  Left hip surgical incisions well approximated staples no signs of infection.  Very small seroma present.  Calf supple nontender.  Dorsiflexion plantarflexion left ankle intact.  Impression: Status post left total hip arthroplasty  Plan: We will start working on scar tissue mobilization.  He will take an 81 mg aspirin for another week and then takes continuous he was on no aspirin.  He will follow-up with [place] in 1 month sooner if there is any questions concerns.  Staples removed Steri-Strips  applied.                             +

## 2020-09-06 NOTE — Telephone Encounter (Signed)
[employer] forms received. To [employer]. 

## 2020-09-12 ENCOUNTER — Telehealth: Payer: Self-pay

## 2020-09-12 NOTE — Telephone Encounter (Signed)
Gregory Howard from [facility] called states patient is progressing well. Being discharged from HHPT services. BP was elevated-asymptomatic (160/100). States he has contacted his PCP. If you have questions or concerns please call him at [phone]

## 2020-09-12 NOTE — Telephone Encounter (Signed)
FYI

## 2020-09-18 ENCOUNTER — Telehealth: Payer: Self-pay | Admitting: Physician Assistant

## 2020-09-18 NOTE — Telephone Encounter (Signed)
Received medical records release form and $25.00 check from patient /Forwarding to [employer] today 

## 2020-09-24 ENCOUNTER — Encounter: Payer: Self-pay | Admitting: Physician Assistant

## 2020-09-24 ENCOUNTER — Ambulatory Visit (INDEPENDENT_AMBULATORY_CARE_PROVIDER_SITE_OTHER): Payer: BC Managed Care – PPO | Admitting: Physician Assistant

## 2020-09-24 DIAGNOSIS — Z96642 Presence of left artificial hip joint: Secondary | ICD-10-CM

## 2020-09-24 NOTE — Progress Notes (Signed)
HPI: 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  He is overall doing well and is ambulating with a cane whenever he is active at home.  He does state that he has difficulty still putting on shoes and socks on the left side.  He is asking about his overall restrictions and return to work.  Physical exam: General well-developed well-nourished male no acute distress. Left hip: Surgical incisions healed well no signs of infection.  Dorsiflexion plantarflexion left ankle intact.  He ambulates about the room without an antalgic gait or any assistive device.   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  He is activities as tolerated otherwise.  We will see him back in 1 month reevaluate his work status at that time.  Questions encouraged and answered

## 2020-10-02 ENCOUNTER — Other Ambulatory Visit: Payer: Self-pay | Admitting: Surgical

## 2020-10-02 DIAGNOSIS — Z96641 Presence of right artificial hip joint: Secondary | ICD-10-CM

## 2020-10-02 DIAGNOSIS — Z96642 Presence of left artificial hip joint: Secondary | ICD-10-CM

## 2020-10-04 ENCOUNTER — Encounter: Payer: BC Managed Care – PPO | Admitting: Orthopaedic Surgery

## 2020-10-17 ENCOUNTER — Telehealth: Payer: Self-pay

## 2020-10-17 NOTE — Telephone Encounter (Signed)
Pt called stating that he is waiting on a form stating stating the date of his surgery that is the only thing that is holding up his Short term disability

## 2020-10-17 NOTE — Telephone Encounter (Signed)
I called and talked to the pt. He needs to [place] to call #[phone] to confirm sx date. I told him I would call soon.

## 2020-10-19 NOTE — Telephone Encounter (Signed)
I called for the pt and gave them this information

## 2020-10-22 ENCOUNTER — Other Ambulatory Visit: Payer: Self-pay

## 2020-10-22 ENCOUNTER — Encounter: Payer: Self-pay | Admitting: Orthopaedic Surgery

## 2020-10-22 ENCOUNTER — Ambulatory Visit (INDEPENDENT_AMBULATORY_CARE_PROVIDER_SITE_OTHER): Payer: BC Managed Care – PPO | Admitting: Orthopaedic Surgery

## 2020-10-22 DIAGNOSIS — Z96642 Presence of left artificial hip joint: Secondary | ICD-10-CM

## 2020-10-22 NOTE — Progress Notes (Signed)
The patient is now 8 weeks status post a left total hip arthroplasty.  He says he is doing well with increasing his left hip strength and range of motion.  He is still having some stiffness with putting his shoes and socks on.  Overall he is satisfied and wants [place] to give him a note stating that he has no work hour restrictions at this standpoint.  He does have pretty good range of motion of his left operative hip on exam today.  He only has a slight limp when he first gets up.  His leg lengths are equal.  At this point we do not need to see him back for 6 months.  At that visit we will have a standing low AP pelvis and lateral of his left hip.  I did give him a work note stating that he has no restrictions or our limitations as it relates to his job.

## 2021-04-22 ENCOUNTER — Ambulatory Visit: Payer: BC Managed Care – PPO | Admitting: Orthopaedic Surgery

## 2021-04-29 ENCOUNTER — Ambulatory Visit (INDEPENDENT_AMBULATORY_CARE_PROVIDER_SITE_OTHER): Payer: BC Managed Care – PPO

## 2021-04-29 ENCOUNTER — Ambulatory Visit (INDEPENDENT_AMBULATORY_CARE_PROVIDER_SITE_OTHER): Payer: BC Managed Care – PPO | Admitting: Orthopaedic Surgery

## 2021-04-29 ENCOUNTER — Encounter: Payer: Self-pay | Admitting: Orthopaedic Surgery

## 2021-04-29 DIAGNOSIS — Z96642 Presence of left artificial hip joint: Secondary | ICD-10-CM | POA: Diagnosis not present

## 2021-04-29 MED ORDER — METHOCARBAMOL 750 MG PO TABS: 750.0000 mg | ORAL_TABLET | Freq: Three times a day (TID) | ORAL | 1 refills | Status: DC | PRN

## 2021-04-29 NOTE — Progress Notes (Signed)
The patient is over 8 months out from a left total hip arthroplasty.  He says the left hip is really doing well.  He does have some stiffness on occasion.  His bigger complaint now is upper back pain and right-sided neck and shoulder pain.  It is more of his neck than he is describing in his upper back. ? ?His left operative hip moves smoothly and fluidly.  His right hip also moves smoothly. ? ?An AP pelvis and lateral of the left hip shows a well-seated total hip arthroplasty with no complicating features. ? ?From a neck and upper back standpoint, I would recommend physical therapy but he rather try some medication for so I will send in Robaxin-750 milligrams to take as needed.  If things worsen he will let us know because we would then set him up for outpatient physical therapy. ?

## 2021-05-06 ENCOUNTER — Other Ambulatory Visit: Payer: Self-pay | Admitting: Gastroenterology

## 2021-05-06 DIAGNOSIS — Z1211 Encounter for screening for malignant neoplasm of colon: Secondary | ICD-10-CM | POA: Diagnosis not present

## 2021-05-06 DIAGNOSIS — R1310 Dysphagia, unspecified: Secondary | ICD-10-CM | POA: Diagnosis not present

## 2021-05-13 ENCOUNTER — Ambulatory Visit
Admission: RE | Admit: 2021-05-13 | Discharge: 2021-05-13 | Disposition: A | Discharge status: Home / Self Care | Payer: BC Managed Care – PPO | Source: Ambulatory Visit | Attending: Gastroenterology | Admitting: Gastroenterology

## 2021-05-13 DIAGNOSIS — K449 Diaphragmatic hernia without obstruction or gangrene: Secondary | ICD-10-CM | POA: Diagnosis not present

## 2021-05-13 DIAGNOSIS — K224 Dyskinesia of esophagus: Secondary | ICD-10-CM | POA: Diagnosis not present

## 2021-05-13 DIAGNOSIS — R1310 Dysphagia, unspecified: Secondary | ICD-10-CM

## 2021-05-13 DIAGNOSIS — K219 Gastro-esophageal reflux disease without esophagitis: Secondary | ICD-10-CM | POA: Diagnosis not present

## 2021-05-24 DIAGNOSIS — Z13220 Encounter for screening for lipoid disorders: Secondary | ICD-10-CM | POA: Diagnosis not present

## 2021-05-24 DIAGNOSIS — Z0000 Encounter for general adult medical examination without abnormal findings: Secondary | ICD-10-CM | POA: Diagnosis not present

## 2021-05-24 DIAGNOSIS — R7301 Impaired fasting glucose: Secondary | ICD-10-CM | POA: Diagnosis not present

## 2021-05-24 DIAGNOSIS — Z125 Encounter for screening for malignant neoplasm of prostate: Secondary | ICD-10-CM | POA: Diagnosis not present

## 2021-05-28 DIAGNOSIS — Z23 Encounter for immunization: Secondary | ICD-10-CM | POA: Diagnosis not present

## 2021-05-28 DIAGNOSIS — Z0000 Encounter for general adult medical examination without abnormal findings: Secondary | ICD-10-CM | POA: Diagnosis not present

## 2021-05-28 DIAGNOSIS — R7303 Prediabetes: Secondary | ICD-10-CM | POA: Diagnosis not present

## 2021-05-28 DIAGNOSIS — I10 Essential (primary) hypertension: Secondary | ICD-10-CM | POA: Diagnosis not present

## 2021-05-28 DIAGNOSIS — M542 Cervicalgia: Secondary | ICD-10-CM | POA: Diagnosis not present

## 2021-05-28 DIAGNOSIS — E7800 Pure hypercholesterolemia, unspecified: Secondary | ICD-10-CM | POA: Diagnosis not present

## 2021-06-07 DIAGNOSIS — Z23 Encounter for immunization: Secondary | ICD-10-CM | POA: Diagnosis not present

## 2021-07-03 DIAGNOSIS — N401 Enlarged prostate with lower urinary tract symptoms: Secondary | ICD-10-CM | POA: Diagnosis not present

## 2021-07-03 DIAGNOSIS — R350 Frequency of micturition: Secondary | ICD-10-CM | POA: Diagnosis not present

## 2021-07-09 ENCOUNTER — Other Ambulatory Visit: Payer: Self-pay | Admitting: Family Medicine

## 2021-07-09 DIAGNOSIS — Z136 Encounter for screening for cardiovascular disorders: Secondary | ICD-10-CM

## 2021-07-10 DIAGNOSIS — R350 Frequency of micturition: Secondary | ICD-10-CM | POA: Diagnosis not present

## 2021-07-10 DIAGNOSIS — N401 Enlarged prostate with lower urinary tract symptoms: Secondary | ICD-10-CM | POA: Diagnosis not present

## 2021-08-13 DIAGNOSIS — R1310 Dysphagia, unspecified: Secondary | ICD-10-CM | POA: Diagnosis not present

## 2021-08-13 DIAGNOSIS — Z1211 Encounter for screening for malignant neoplasm of colon: Secondary | ICD-10-CM | POA: Diagnosis not present

## 2021-08-13 DIAGNOSIS — K648 Other hemorrhoids: Secondary | ICD-10-CM | POA: Diagnosis not present

## 2021-08-13 DIAGNOSIS — K222 Esophageal obstruction: Secondary | ICD-10-CM | POA: Diagnosis not present

## 2021-08-13 DIAGNOSIS — K2970 Gastritis, unspecified, without bleeding: Secondary | ICD-10-CM | POA: Diagnosis not present

## 2021-08-13 DIAGNOSIS — K2930 Chronic superficial gastritis without bleeding: Secondary | ICD-10-CM | POA: Diagnosis not present

## 2021-08-13 DIAGNOSIS — R933 Abnormal findings on diagnostic imaging of other parts of digestive tract: Secondary | ICD-10-CM | POA: Diagnosis not present

## 2021-08-27 ENCOUNTER — Other Ambulatory Visit: Payer: BC Managed Care – PPO

## 2021-10-18 DIAGNOSIS — Z23 Encounter for immunization: Secondary | ICD-10-CM | POA: Diagnosis not present

## 2021-11-06 ENCOUNTER — Other Ambulatory Visit: Payer: BC Managed Care – PPO

## 2021-11-22 ENCOUNTER — Ambulatory Visit
Admission: RE | Admit: 2021-11-22 | Discharge: 2021-11-22 | Disposition: A | Discharge status: Home / Self Care | Payer: No Typology Code available for payment source | Source: Ambulatory Visit | Attending: Family Medicine | Admitting: Family Medicine

## 2021-11-22 DIAGNOSIS — I2510 Atherosclerotic heart disease of native coronary artery without angina pectoris: Secondary | ICD-10-CM | POA: Diagnosis not present

## 2021-11-22 DIAGNOSIS — Z136 Encounter for screening for cardiovascular disorders: Secondary | ICD-10-CM

## 2022-01-16 DIAGNOSIS — L821 Other seborrheic keratosis: Secondary | ICD-10-CM | POA: Diagnosis not present

## 2022-01-16 DIAGNOSIS — D692 Other nonthrombocytopenic purpura: Secondary | ICD-10-CM | POA: Diagnosis not present

## 2022-05-27 DIAGNOSIS — Z125 Encounter for screening for malignant neoplasm of prostate: Secondary | ICD-10-CM | POA: Diagnosis not present

## 2022-05-27 DIAGNOSIS — E559 Vitamin D deficiency, unspecified: Secondary | ICD-10-CM | POA: Diagnosis not present

## 2022-05-27 DIAGNOSIS — Z0000 Encounter for general adult medical examination without abnormal findings: Secondary | ICD-10-CM | POA: Diagnosis not present

## 2022-05-27 DIAGNOSIS — R7303 Prediabetes: Secondary | ICD-10-CM | POA: Diagnosis not present

## 2022-05-27 DIAGNOSIS — Z13220 Encounter for screening for lipoid disorders: Secondary | ICD-10-CM | POA: Diagnosis not present

## 2022-06-02 DIAGNOSIS — I10 Essential (primary) hypertension: Secondary | ICD-10-CM | POA: Diagnosis not present

## 2022-06-02 DIAGNOSIS — R7303 Prediabetes: Secondary | ICD-10-CM | POA: Diagnosis not present

## 2022-06-02 DIAGNOSIS — E7800 Pure hypercholesterolemia, unspecified: Secondary | ICD-10-CM | POA: Diagnosis not present

## 2022-06-02 DIAGNOSIS — Z0000 Encounter for general adult medical examination without abnormal findings: Secondary | ICD-10-CM | POA: Diagnosis not present

## 2022-06-02 DIAGNOSIS — R1310 Dysphagia, unspecified: Secondary | ICD-10-CM | POA: Diagnosis not present

## 2022-07-09 DIAGNOSIS — R350 Frequency of micturition: Secondary | ICD-10-CM | POA: Diagnosis not present

## 2022-07-09 DIAGNOSIS — N401 Enlarged prostate with lower urinary tract symptoms: Secondary | ICD-10-CM | POA: Diagnosis not present

## 2022-07-09 DIAGNOSIS — Z87442 Personal history of urinary calculi: Secondary | ICD-10-CM | POA: Diagnosis not present

## 2022-07-28 ENCOUNTER — Other Ambulatory Visit (INDEPENDENT_AMBULATORY_CARE_PROVIDER_SITE_OTHER): Payer: BC Managed Care – PPO

## 2022-07-28 ENCOUNTER — Encounter: Payer: Self-pay | Admitting: Physician Assistant

## 2022-07-28 ENCOUNTER — Telehealth: Payer: Self-pay | Admitting: Physician Assistant

## 2022-07-28 ENCOUNTER — Ambulatory Visit (INDEPENDENT_AMBULATORY_CARE_PROVIDER_SITE_OTHER): Payer: BC Managed Care – PPO | Admitting: Physician Assistant

## 2022-07-28 DIAGNOSIS — M25562 Pain in left knee: Secondary | ICD-10-CM | POA: Diagnosis not present

## 2022-07-28 DIAGNOSIS — Z96642 Presence of left artificial hip joint: Secondary | ICD-10-CM

## 2022-07-28 MED ORDER — METHYLPREDNISOLONE ACETATE 40 MG/ML IJ SUSP
40.0000 mg | INTRAMUSCULAR | Status: AC | PRN
  Administered 2022-07-28: 40 mg via INTRA_ARTICULAR

## 2022-07-28 MED ORDER — LIDOCAINE HCL 1 % IJ SOLN
5.0000 mL | INTRAMUSCULAR | Status: AC | PRN
  Administered 2022-07-28: 5 mL

## 2022-07-28 NOTE — Telephone Encounter (Signed)
Pt needs to follow up in 2 weeks no avail appts would like as late as possible in afternoon okay to leave VM

## 2022-07-28 NOTE — Progress Notes (Signed)
Office Visit Note   Patient: Gregory Howard           Date of Birth: 05/09/1960           MRN: [id number] Visit Date: 07/28/2022              Requested by: Daisy Floro, MD [address] [place],  [place] [address] PCP: Daisy Floro, MD   Assessment & Plan: Visit Diagnoses:  1. Acute pain of left knee   2. Status post total replacement of left hip     Plan: Will see him back in 2 weeks to see what type of response he had to the injection.  He will work on Dance movement psychotherapist as discussed.  Questions were encouraged and answered at length today.  Follow-Up Instructions: Return in about 2 weeks (around 08/11/2022).   Orders:  Orders Placed This Encounter  Procedures   Large Joint Inj   XR HIP UNILAT W OR W/O PELVIS 2-3 VIEWS LEFT   XR Knee 1-2 Views Left   No orders of the defined types were placed in this encounter.     Procedures: Large Joint Inj on 07/28/2022 4:45 PM Indications: pain Details: 22 G 1.5 in needle, anterolateral approach  Arthrogram: No  Medications: 5 mL lidocaine 1 %; 40 mg methylPREDNISolone acetate 40 MG/ML Aspirate: 7 mL yellow and blood-tinged Outcome: tolerated well, no immediate complications Procedure, treatment alternatives, risks and benefits explained, specific risks discussed. Consent was given by the patient. Immediately prior to procedure a time out was called to verify the correct patient, procedure, equipment, support staff and site/side marked as required. Patient was prepped and draped in the usual sterile fashion.       Clinical Data: No additional findings.   Subjective: Chief Complaint  Patient presents with   Left Hip - Pain   Left Knee - Pain    HPI Mr. Mah comes in today with left knee pain.  He states that this started about a month ago.  He notes he has had some popping in both knees for years.  However month ago he started having pain from the knee down to the lateral malleolus.  Pain is lateral  aspect of the leg.  No numbness tingling.  Then he had some pain in the distal femur region.  Does note some groin pain he has a history of left total hip arthroplasty which was performed 08/24/2020.  The groin pain really for the most part is resolved.  States a giving way sensation of his leg.  He denies any painful popping catching locking or swelling.  He does note that he has to sleep with a pillow between his knees.  He has tried Aleve and Tylenol which has helped.  Review of Systems  Constitutional:  Negative for chills and fever.     Objective: Vital Signs: There were no vitals taken for this visit.  Physical Exam Constitutional:      Appearance: He is not ill-appearing or diaphoretic.  Pulmonary:     Effort: Pulmonary effort is normal.  Neurological:     Mental Status: He is alert.  Psychiatric:        Mood and Affect: Mood normal.        Behavior: Behavior normal.     Ortho Exam Bilateral hips good range of motion without pain.  Left hip tenderness over the trochanteric region.  No tenderness over the right hip trochanteric region. Bilateral knees good range of motion both  knees.  Patellofemoral crepitus bilaterally.  Left knee tenderness along medial joint line.  No instability valgus varus stressing of either knee.  McMurray's is positive on the left negative on the right.  Right knee no abnormal warmth erythema or effusion.  Left knee no abnormal warmth or erythema.  Positive effusion. Specialty Comments:  No specialty comments available.  Imaging: XR HIP UNILAT W OR W/O PELVIS 2-3 VIEWS LEFT  Result Date: 07/28/2022 AP pelvis lateral view of the left hip: Hips well located.  Status post left total hip arthroplasty well-seated components.  No acute fractures no acute findings.  XR Knee 1-2 Views Left  Result Date: 07/28/2022 Left knee 2 views: Knee is well located.  No acute fractures.  Mild to moderate patellofemoral changes.  Medial joint line overall well-preserved.   Osteophytes off the medial joint line.  Lateral compartment well-preserved.    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 No pertinent family history.  Past Surgical History:  Procedure Laterality Date   EXTRACORPOREAL SHOCK WAVE LITHOTRIPSY Right 03/23/2017   Procedure: RIGHT EXTRACORPOREAL SHOCK WAVE LITHOTRIPSY (ESWL);  Surgeon: Marcine Matar, MD;  Location: [facility];  Service: Urology;  Laterality: Right;   HERNIA REPAIR     done as child   LITHOTRIPSY     TOTAL HIP ARTHROPLASTY Left 08/24/2020   Procedure: LEFT TOTAL HIP ARTHROPLASTY ANTERIOR APPROACH;  Surgeon: [name], MD;  Location: [facility];  Service: Orthopedics;  Laterality: Left;   Social History   Occupational History   Not on file  Tobacco Use   Smoking status: Former   Smokeless tobacco: Never  Vaping Use   Vaping Use: Never used  Substance and Sexual Activity   Alcohol use: No   Drug use: No   Sexual activity: Not on file

## 2022-08-12 ENCOUNTER — Ambulatory Visit: Payer: BC Managed Care – PPO | Admitting: Physician Assistant

## 2022-11-10 DIAGNOSIS — L821 Other seborrheic keratosis: Secondary | ICD-10-CM | POA: Diagnosis not present

## 2022-11-10 DIAGNOSIS — D229 Melanocytic nevi, unspecified: Secondary | ICD-10-CM | POA: Diagnosis not present

## 2022-11-10 DIAGNOSIS — L814 Other melanin hyperpigmentation: Secondary | ICD-10-CM | POA: Diagnosis not present

## 2022-11-10 DIAGNOSIS — L578 Other skin changes due to chronic exposure to nonionizing radiation: Secondary | ICD-10-CM | POA: Diagnosis not present

## 2022-12-22 ENCOUNTER — Encounter: Payer: Self-pay | Admitting: Physician Assistant

## 2022-12-22 ENCOUNTER — Ambulatory Visit (INDEPENDENT_AMBULATORY_CARE_PROVIDER_SITE_OTHER): Payer: BC Managed Care – PPO | Admitting: Physician Assistant

## 2022-12-22 ENCOUNTER — Other Ambulatory Visit: Payer: Self-pay | Admitting: Radiology

## 2022-12-22 DIAGNOSIS — M25562 Pain in left knee: Secondary | ICD-10-CM

## 2022-12-22 MED ORDER — METHYLPREDNISOLONE ACETATE 40 MG/ML IJ SUSP
40.0000 mg | INTRAMUSCULAR | Status: AC | PRN
  Administered 2022-12-22: 40 mg via INTRA_ARTICULAR

## 2022-12-22 MED ORDER — LIDOCAINE HCL 1 % IJ SOLN
4.0000 mL | INTRAMUSCULAR | Status: AC | PRN
  Administered 2022-12-22: 4 mL

## 2022-12-22 NOTE — Progress Notes (Signed)
HPI: Gregory Howard returns today due to left knee pain.  Last saw him in June of this year.  This provided cortisone injection in her knee.  He states he was doing overall well until about 3 weeks ago developed severe pain in the knee.  He notes that the knee is swelling and he cannot bend it as far as the right knee.  No acute injury.  Pain is medial aspect of the knee.  Having difficulty sleeping even with a pillow between his knees.  Been taking Tylenol and icing and heating the knee which helps some.  Review of systems: See HPI otherwise negative  Physical exam: General Well-developed well-nourished male walks with an antalgic gait without any assistive device. Bilateral knees: Both knees full extension.  Full flexion of the right knee without pain.  Left knee active and passive flexion only to 95 degrees.  No instability valgus varus stressing of either knee.  McMurray's positive on the left knee.  Anterior drawer left knee is negative.  Right knee no abnormal warmth erythema or effusion.  Left knee positive effusion.  Tenderness along medial joint line left knee.  Impression: Left knee recurrent effusion  Plan: Recommend aspiration cortisone injection today.  Given mostly has patellofemoral arthritic changes on radiographs but recommend MRI to rule out meniscal tear as he does have a mechanical block on exam today.  Follow-up after the MRI to go over results and discuss further treatment.  Questions were encouraged and answered at length.     Procedure Note  Patient: Gregory Howard             Date of Birth: May 24, 1960           MRN: [id number]             Visit Date: 12/22/2022  Procedures: Visit Diagnoses:  1. Acute pain of left knee     Large Joint Inj: L knee on 12/22/2022 4:28 PM Indications: pain Details: 22 G 1.5 in needle, superolateral approach  Arthrogram: No  Medications: 4 mL lidocaine 1 %; 40 mg methylPREDNISolone acetate 40 MG/ML Aspirate: 15 mL bloody Outcome:  tolerated well, no immediate complications Procedure, treatment alternatives, risks and benefits explained, specific risks discussed. Consent was given by the patient. Immediately prior to procedure a time out was called to verify the correct patient, procedure, equipment, support staff and site/side marked as required. Patient was prepped and draped in the usual sterile fashion.

## 2023-01-13 ENCOUNTER — Other Ambulatory Visit: Payer: BC Managed Care – PPO

## 2023-01-22 ENCOUNTER — Encounter: Payer: Self-pay | Admitting: Orthopaedic Surgery

## 2023-01-22 ENCOUNTER — Ambulatory Visit: Payer: BC Managed Care – PPO | Admitting: Orthopaedic Surgery

## 2023-01-22 DIAGNOSIS — M1712 Unilateral primary osteoarthritis, left knee: Secondary | ICD-10-CM | POA: Insufficient documentation

## 2023-01-22 NOTE — Progress Notes (Signed)
The patient is an active 62 year old male who comes in for follow-up after having a MRI of his left knee.  He is having significantly worsening left knee pain and we had aspirated his knee already on 2 different occasions for large effusion and placed a steroid injection in his knee.  We have replaced the hip remotely.  He does work on his feet all day long.  His left knee pain is becoming more significant and it is daily.  It is waking up at night.  It is detrimentally affecting his mobility, his quality of life and his actives daily living.  Examination of his left knee today shows a large effusion again.  I was able to aspirate a significant amount of clear yellow fluid from his left knee.  There is significant pain along the medial joint line of his knee and this is limiting his range of motion.  The knee feels limply stable.  MRI of his left knee is reviewed.  There is a complex left medial meniscal tear but there is areas of full-thickness cartilage loss in the medial compartment of his knee.  There is also significant edema in the weightbearing surface of the lateral femoral condyle and the medial femoral condyle.  There is significant patellofemoral arthritis as well.  The ACL and PCL are intact and the lateral meniscus is intact.  Given the significance of his left knee MRI findings, the only surgical option would be a knee replacement.  I explained this to him in detail.  We discussed the risks and benefits of this type of surgery and what to expect from an intraoperative and postoperative standpoint.  His arthritis is significant enough that an arthroscopic intervention would not be useful or helpful at all.  I did show him a knee replacement model and described in detail what the surgery involves.  He is going to talk to his wife.  I told him he would need to be out of work likely up to 12 weeks since he is in a job where he has to stand and walk all day long.  He understands this as well.  All  questions and concerns were answered and addressed.  Will work on getting on the schedule.

## 2023-02-02 ENCOUNTER — Telehealth: Payer: Self-pay | Admitting: Orthopaedic Surgery

## 2023-02-02 NOTE — Telephone Encounter (Signed)
Patient called today and said he was seen on the 19th of December.  He is wanting to schedule total knee surgery with Dr. Magnus Ivan.  Called patient back and explained we have are experiencing a heavy call volume at this time with folks wanting surgery and the scheduler is out of the office but will call him back and offer a date when she gets the chance. He states the doctor told him he was 5-6 weeks out in scheduling case, so he wasn't expecting a date in January, but is more than willing to take the first available.  [phone] is the best number to reach patient.

## 2023-02-10 NOTE — Telephone Encounter (Signed)
I called patient and scheduled surgery. 

## 2023-03-03 NOTE — Patient Instructions (Signed)
SURGICAL WAITING ROOM VISITATION  Patients having surgery or a procedure may have no more than 2 support people in the waiting area - these visitors may rotate.    Children under the age of 58 must have an adult with them who is not the patient.  Due to an increase in RSV and influenza rates and associated hospitalizations, children ages 1 and under may not visit patients in [place] hospitals.  Visitors with respiratory illnesses are discouraged from visiting and should remain at home.  If the patient needs to stay at the hospital during part of their recovery, the visitor guidelines for inpatient rooms apply. Pre-op nurse will coordinate an appropriate time for 1 support person to accompany patient in pre-op.  This support person may not rotate.    Please refer to the [facility] website for the visitor guidelines for Inpatients (after your surgery is over and you are in a regular room).       Your procedure is scheduled on: 03/13/23   Report to [facility] Main Entrance    Report to admitting at 8:45 AM   Call this number if you have problems the morning of surgery [phone]   Do not eat food :After Midnight.   After Midnight you may have the following liquids until 8:15 AM DAY OF SURGERY  Water Non-Citrus Juices (without pulp, NO RED-Apple, White grape, White cranberry) Black Coffee (NO MILK/CREAM OR CREAMERS, sugar ok)  Clear Tea (NO MILK/CREAM OR CREAMERS, sugar ok) regular and decaf                             Plain Jell-O (NO RED)                                           Fruit ices (not with fruit pulp, NO RED)                                     Popsicles (NO RED)                                                               Sports drinks like Gatorade (NO RED)                The day of surgery:  Drink ONE (1) Pre-Surgery Clear Ensure at 8:15 AM the morning of surgery. Drink in one sitting. Do not sip.  This drink was given to you during your hospital   pre-op appointment visit. Nothing else to drink after completing the  Pre-Surgery Clear Ensure .Marland Kitchen    Oral Hygiene is also important to reduce your risk of infection.                                    Remember - BRUSH YOUR TEETH THE MORNING OF SURGERY WITH YOUR REGULAR TOOTHPASTE  DENTURES WILL BE REMOVED PRIOR TO SURGERY PLEASE DO NOT APPLY "Poly grip" OR ADHESIVES!!!   Stop all vitamins and  herbal supplements 7 days before surgery.   Take these medicines the morning of surgery with A SIP OF WATER: Tylenol if needed, amlodipine, pantoprazole(Protonix)             You may not have any metal on your body including hair pins, jewelry, and body piercing             Do not wear make-up, lotions, powders, perfumes/cologne, or deodorant              Men may shave face and neck.   Do not bring valuables to the hospital. [place] IS NOT             RESPONSIBLE   FOR VALUABLES.   Contacts, glasses, dentures or bridgework may not be worn into surgery.   Bring small overnight bag day of surgery.   DO NOT BRING YOUR HOME MEDICATIONS TO THE HOSPITAL. PHARMACY WILL DISPENSE MEDICATIONS LISTED ON YOUR MEDICATION LIST TO YOU DURING YOUR ADMISSION IN THE HOSPITAL!    Patients discharged on the day of surgery will not be allowed to drive home.  Someone NEEDS to stay with you for the first 24 hours after anesthesia.   Special Instructions: Bring a copy of your healthcare power of attorney and living will documents the day of surgery if you haven't scanned them before.              Please read over the following fact sheets you were given: IF YOU HAVE QUESTIONS ABOUT YOUR PRE-OP INSTRUCTIONS PLEASE CALL [phone] Rosey Bath   If you received a COVID test during your pre-op visit  it is requested that you wear a mask when out in public, stay away from anyone that may not be feeling well and notify your surgeon if you develop symptoms. If you test positive for Covid or have been in contact with  anyone that has tested positive in the last 10 days please notify you surgeon.      Pre-operative 5 CHG Bath Instructions   You can play a key role in reducing the risk of infection after surgery. Your skin needs to be as free of germs as possible. You can reduce the number of germs on your skin by washing with CHG (chlorhexidine gluconate) soap before surgery. CHG is an antiseptic soap that kills germs and continues to kill germs even after washing.   DO NOT use if you have an allergy to chlorhexidine/CHG or antibacterial soaps. If your skin becomes reddened or irritated, stop using the CHG and notify one of our RNs at [phone].   Please shower with the CHG soap starting 4 days before surgery using the following schedule:     Please keep in mind the following:  DO NOT shave, including legs and underarms, starting the day of your first shower.   You may shave your face at any point before/day of surgery.  Place clean sheets on your bed the day you start using CHG soap. Use a clean washcloth (not used since being washed) for each shower. DO NOT sleep with pets once you start using the CHG.   CHG Shower Instructions:  If you choose to wash your hair and private area, wash first with your normal shampoo/soap.  After you use shampoo/soap, rinse your hair and body thoroughly to remove shampoo/soap residue.  Turn the water OFF and apply about 3 tablespoons (45 ml) of CHG soap to a CLEAN washcloth.  Apply CHG soap ONLY FROM YOUR NECK DOWN TO  YOUR TOES (washing for 3-5 minutes)  DO NOT use CHG soap on face, private areas, open wounds, or sores.  Pay special attention to the area where your surgery is being performed.  If you are having back surgery, having someone wash your back for you may be helpful. Wait 2 minutes after CHG soap is applied, then you may rinse off the CHG soap.  Pat dry with a clean towel  Put on clean clothes/pajamas   If you choose to wear lotion, please use ONLY the  CHG-compatible lotions on the back of this paper.     Additional instructions for the day of surgery: DO NOT APPLY any lotions, deodorants, cologne, or perfumes.   Put on clean/comfortable clothes.  Brush your teeth.  Ask your nurse before applying any prescription medications to the skin.      CHG Compatible Lotions   Aveeno Moisturizing lotion  Cetaphil Moisturizing Cream  Cetaphil Moisturizing Lotion  Clairol Herbal Essence Moisturizing Lotion, Dry Skin  Clairol Herbal Essence Moisturizing Lotion, Extra Dry Skin  Clairol Herbal Essence Moisturizing Lotion, Normal Skin  Curel Age Defying Therapeutic Moisturizing Lotion with Alpha Hydroxy  Curel Extreme Care Body Lotion  Curel Soothing Hands Moisturizing Hand Lotion  Curel Therapeutic Moisturizing Cream, Fragrance-Free  Curel Therapeutic Moisturizing Lotion, Fragrance-Free  Curel Therapeutic Moisturizing Lotion, Original Formula  Eucerin Daily Replenishing Lotion  Eucerin Dry Skin Therapy Plus Alpha Hydroxy Crme  Eucerin Dry Skin Therapy Plus Alpha Hydroxy Lotion  Eucerin Original Crme  Eucerin Original Lotion  Eucerin Plus Crme Eucerin Plus Lotion  Eucerin TriLipid Replenishing Lotion  Keri Anti-Bacterial Hand Lotion  Keri Deep Conditioning Original Lotion Dry Skin Formula Softly Scented  Keri Deep Conditioning Original Lotion, Fragrance Free Sensitive Skin Formula  Keri Lotion Fast Absorbing Fragrance Free Sensitive Skin Formula  Keri Lotion Fast Absorbing Softly Scented Dry Skin Formula  Keri Original Lotion  Keri Skin Renewal Lotion Keri Silky Smooth Lotion  Keri Silky Smooth Sensitive Skin Lotion  Nivea Body Creamy Conditioning Oil  Nivea Body Extra Enriched Lotion  Nivea Body Original Lotion  Nivea Body Sheer Moisturizing Lotion Nivea Crme  Nivea Skin Firming Lotion  NutraDerm 30 Skin Lotion  NutraDerm Skin Lotion  NutraDerm Therapeutic Skin Cream  NutraDerm Therapeutic Skin Lotion  ProShield Protective  Hand Cream    Incentive Spirometer (Watch this video at home: [email or web address])  An incentive spirometer is a tool that can help keep your lungs clear and active. This tool measures how well you are filling your lungs with each breath. Taking long deep breaths may help reverse or decrease the chance of developing breathing (pulmonary) problems (especially infection) following: A long period of time when you are unable to move or be active. BEFORE THE PROCEDURE  If the spirometer includes an indicator to show your best effort, your nurse or respiratory therapist will set it to a desired goal. If possible, sit up straight or lean slightly forward. Try not to slouch. Hold the incentive spirometer in an upright position. INSTRUCTIONS FOR USE  Sit on the edge of your bed if possible, or sit up as far as you can in bed or on a chair. Hold the incentive spirometer in an upright position. Breathe out normally. Place the mouthpiece in your mouth and seal your lips tightly around it. Breathe in slowly and as deeply as possible, raising the piston or the ball toward the top of the column. Hold your breath for 3-5 seconds or for as long as possible. Allow the  piston or ball to fall to the bottom of the column. Remove the mouthpiece from your mouth and breathe out normally. Rest for a few seconds and repeat Steps 1 through 7 at least 10 times every 1-2 hours when you are awake. Take your time and take a few normal breaths between deep breaths. The spirometer may include an indicator to show your best effort. Use the indicator as a goal to work toward during each repetition. After each set of 10 deep breaths, practice coughing to be sure your lungs are clear. If you have an incision (the cut made at the time of surgery), support your incision when coughing by placing a pillow or rolled up towels firmly against it. Once you are able to get out of bed, walk around indoors and cough  well. You may stop using the incentive spirometer when instructed by your caregiver.  RISKS AND COMPLICATIONS Take your time so you do not get dizzy or light-headed. If you are in pain, you may need to take or ask for pain medication before doing incentive spirometry. It is harder to take a deep breath if you are having pain. AFTER USE Rest and breathe slowly and easily. It can be helpful to keep track of a log of your progress. Your caregiver can provide you with a simple table to help with this. If you are using the spirometer at home, follow these instructions: SEEK MEDICAL CARE IF:  You are having difficultly using the spirometer. You have trouble using the spirometer as often as instructed. Your pain medication is not giving enough relief while using the spirometer. You develop fever of 100.5 F (38.1 C) or higher. SEEK IMMEDIATE MEDICAL CARE IF:  You cough up bloody sputum that had not been present before. You develop fever of 102 F (38.9 C) or greater. You develop worsening pain at or near the incision site. MAKE SURE YOU:  Understand these instructions. Will watch your condition. Will get help right away if you are not doing well or get worse. Document Released: 06/02/2006 Document Revised: 04/14/2011 Document Reviewed: 08/03/2006 [facility] Patient Information 2014 [place], [place].

## 2023-03-03 NOTE — Progress Notes (Signed)
COVID Vaccine received:  []  No [x]  Yes Date of any COVID positive Test in last 90 days: no PCP - Gildardo Cranker MD Cardiologist -   Chest x-ray -  EKG -  03/06/23 Epic Stress Test -  ECHO -  Cardiac Cath -   Bowel Prep - [x]  No  []   Yes ______  Pacemaker / ICD device [x]  No []  Yes   Spinal Cord Stimulator:[x]  No []  Yes       History of Sleep Apnea? []  No [x]  Yes   CPAP used?- [x]  No []  Yes    Does the patient monitor blood sugar?          [x]  No []  Yes  []  N/A  Patient has: []  NO Hx DM   [x]  Pre-DM                 []  DM1  []   DM2 Does patient have a [employer] or [employer]om? []  No []  Yes   Fasting Blood Sugar Ranges-  Checks Blood Sugar _____ times a day  GLP1 agonist / usual dose - no GLP1 instructions:  SGLT-2 inhibitors / usual dose - no SGLT-2 instructions:   Blood Thinner / Instructions:no Aspirin Instructions:no  Comments:   Activity level: Patient is able  to climb a flight of stairs without difficulty; [x]  No CP  [x]  No SOB, _   Patient can  perform ADLs without assistance.   Anesthesia review:   Patient denies shortness of breath, fever, cough and chest pain at PAT appointment.  Patient verbalized understanding and agreement to the Pre-Surgical Instructions that were given to them at this PAT appointment. Patient was also educated of the need to review these PAT instructions again prior to his/her surgery.I reviewed the appropriate phone numbers to call if they have any and questions or concerns.

## 2023-03-06 ENCOUNTER — Encounter (HOSPITAL_COMMUNITY): Payer: Self-pay

## 2023-03-06 ENCOUNTER — Encounter (HOSPITAL_COMMUNITY)
Admission: RE | Admit: 2023-03-06 | Discharge: 2023-03-06 | Disposition: A | Discharge status: Home / Self Care | Payer: BC Managed Care – PPO | Source: Ambulatory Visit | Attending: Orthopaedic Surgery

## 2023-03-06 ENCOUNTER — Other Ambulatory Visit: Payer: Self-pay

## 2023-03-06 VITALS — BP 149/89 | HR 75 | Temp 98.3°F | Resp 16 | Ht 72.0 in | Wt 219.0 lb

## 2023-03-06 DIAGNOSIS — M1712 Unilateral primary osteoarthritis, left knee: Secondary | ICD-10-CM | POA: Diagnosis not present

## 2023-03-06 DIAGNOSIS — Z01818 Encounter for other preprocedural examination: Secondary | ICD-10-CM | POA: Insufficient documentation

## 2023-03-06 LAB — BASIC METABOLIC PANEL
Anion gap: 9 (ref 5–15)
BUN: 21 mg/dL (ref 8–23)
CO2: 27 mmol/L (ref 22–32)
Calcium: 9.2 mg/dL (ref 8.9–10.3)
Chloride: 101 mmol/L (ref 98–111)
Creatinine, Ser: 1.04 mg/dL (ref 0.61–1.24)
GFR, Estimated: >60 mL/min (ref >60)
Glucose, Bld: 117 mg/dL — ABNORMAL HIGH (ref 70–99)
Potassium: 4.6 mmol/L (ref 3.5–5.1)
Sodium: 137 mmol/L (ref 135–145)

## 2023-03-06 LAB — CBC
HCT: 39.8 % (ref 39.0–52.0)
Hemoglobin: 13.5 g/dL (ref 13.0–17.0)
MCH: 30.2 pg (ref 26.0–34.0)
MCHC: 33.9 g/dL (ref 30.0–36.0)
MCV: 89 fL (ref 80.0–100.0)
Platelets: 280 10*3/uL (ref 150–400)
RBC: 4.47 MIL/uL (ref 4.22–5.81)
RDW: 12.7 % (ref 11.5–15.5)
WBC: 6.6 10*3/uL (ref 4.0–10.5)
nRBC: 0 % (ref 0.0–0.2)

## 2023-03-06 LAB — SURGICAL PCR SCREEN
MRSA, PCR: NEGATIVE
Staphylococcus aureus: NEGATIVE

## 2023-03-06 NOTE — Progress Notes (Signed)
COVID Vaccine received:  []  No [x]  Yes Date of any COVID positive Test in last 90 days: no PCP - Gildardo Cranker MD Cardiologist -   Chest x-ray -  EKG -   Stress Test -  ECHO -  Cardiac Cath -   Bowel Prep - [x]  No  []   Yes ______  Pacemaker / ICD device [x]  No []  Yes   Spinal Cord Stimulator:[x]  No []  Yes       History of Sleep Apnea? [x]  No []  Yes   CPAP used?- [x]  No []  Yes    Does the patient monitor blood sugar?          [x]  No []  Yes  []  N/A  Patient has: []  NO Hx DM   [x]  Pre-DM                 []  DM1  []   DM2 Does patient have a [employer] or [employer]? []  No []  Yes   Fasting Blood Sugar Ranges-  Checks Blood Sugar _____ times a day  GLP1 agonist / usual dose - no GLP1 instructions:  SGLT-2 inhibitors / usual dose - no SGLT-2 instructions:   Blood Thinner / Instructions:no Aspirin Instructions:no  Comments:   Activity level: Patient is able to climb a flight of stairs without difficulty; [x]  No CP  [x]  No SOB,    Patient can  perform ADLs without assistance.   Anesthesia review:   Patient denies shortness of breath, fever, cough and chest pain at PAT appointment.  Patient verbalized understanding and agreement to the Pre-Surgical Instructions that were given to them at this PAT appointment. Patient was also educated of the need to review these PAT instructions again prior to his/her surgery.I reviewed the appropriate phone numbers to call if they have any and questions or concerns.

## 2023-03-06 NOTE — Progress Notes (Deleted)
COVID Vaccine received:  []  No [x]  Yes Date of any COVID positive Test in last 90 days: no PCP - Gildardo Cranker MD Cardiologist -   Chest x-ray -  EKG -  03/06/23 Epic Stress Test -  ECHO -  Cardiac Cath -   Bowel Prep - [x]  No  []   Yes ______  Pacemaker / ICD device [x]  No []  Yes   Spinal Cord Stimulator:[]  No []  Yes       History of Sleep Apnea? []  No [x]  Yes   CPAP used?- [x]  No []  Yes    Does the patient monitor blood sugar?          [x]  No []  Yes  []  N/A  Patient has: []  NO Hx DM   [x]  Pre-DM                 []  DM1  []   DM2 Does patient have a [employer] or [employer]? []  No []  Yes   Fasting Blood Sugar Ranges-  Checks Blood Sugar _____ times a day  GLP1 agonist / usual dose - no GLP1 instructions:  SGLT-2 inhibitors / usual dose - no SGLT-2 instructions:   Blood Thinner / Instructions:no Aspirin Instructions:no  Comments:   Activity level: Patient is able climb a flight of stairs without difficulty; [x]  No CP  [x]  No SOB,    Patient can  perform ADLs without assistance.   Anesthesia review:   Patient denies shortness of breath, fever, cough and chest pain at PAT appointment.  Patient verbalized understanding and agreement to the Pre-Surgical Instructions that were given to them at this PAT appointment. Patient was also educated of the need to review these PAT instructions again prior to his/her surgery.I reviewed the appropriate phone numbers to call if they have any and questions or concerns.

## 2023-03-12 NOTE — H&P (Signed)
 TOTAL KNEE ADMISSION H&P  Patient is being admitted for left total knee arthroplasty.  Subjective:  Chief Complaint:left knee pain.  HPI: Gregory Howard, 63 y.o. male, has a history of pain and functional disability in the left knee due to arthritis and has failed non-surgical conservative treatments for greater than 12 weeks to includeNSAID's and/or analgesics, corticosteriod injections, and activity modification.  Onset of symptoms was gradual, starting 1 years ago with gradually worsening course since that time. The patient noted no past surgery on the left knee(s).  Patient currently rates pain in the left knee(s) at 10 out of 10 with activity. Patient has night pain, worsening of pain with activity and weight bearing, pain that interferes with activities of daily living, pain with passive range of motion, crepitus, and joint swelling.  Patient has evidence of subchondral sclerosis, periarticular osteophytes, and joint space narrowing by imaging studies. There is no active infection.  Patient Active Problem List   Diagnosis Date Noted   Unilateral primary osteoarthritis, left knee 01/22/2023   Status post total replacement of left hip 08/24/2020   Past Medical History:  Diagnosis Date   Arthritis    History of kidney stones    Hypertension    Kidney stones    Pre-diabetes     Past Surgical History:  Procedure Laterality Date   EXTRACORPOREAL SHOCK WAVE LITHOTRIPSY Right 03/23/2017   Procedure: RIGHT EXTRACORPOREAL SHOCK WAVE LITHOTRIPSY (ESWL);  Surgeon: Matilda Senior, MD;  Location: [facility];  Service: Urology;  Laterality: Right;   HERNIA REPAIR     done as child   LITHOTRIPSY     TOTAL HIP ARTHROPLASTY Left 08/24/2020   Procedure: LEFT TOTAL HIP ARTHROPLASTY ANTERIOR APPROACH;  Surgeon: Vernetta Lonni GRADE, MD;  Location: [facility];  Service: Orthopedics;  Laterality: Left;    No current facility-administered medications for this encounter.   Current Outpatient Medications   Medication Sig Dispense Refill Last Dose/Taking   acetaminophen  (TYLENOL ) 500 MG tablet Take 500 mg by mouth every 6 (six) hours as needed for moderate pain (pain score 4-6).   Taking As Needed   amLODipine  (NORVASC ) 10 MG tablet Take 10 mg by mouth in the morning.   Taking   benazepril  (LOTENSIN ) 40 MG tablet Take 40 mg by mouth daily.   Taking   BLACK PEPPER-TURMERIC PO Take 1 tablet by mouth daily.   Taking   Cholecalciferol  (VITAMIN D3) 125 MCG (5000 UT) TABS Take 5,000 Units by mouth every evening.   Taking   naproxen sodium (ALEVE) 220 MG tablet Take 220 mg by mouth daily as needed (pain).   Taking As Needed   Omega-3 Fatty Acids (FISH OIL) 1200 MG CAPS Take 2,400 mg by mouth in the morning.   Taking   pantoprazole  (PROTONIX ) 40 MG tablet Take 40 mg by mouth daily.   Taking   Tamsulosin  HCl (FLOMAX ) 0.4 MG CAPS Take 1 capsule (0.4 mg total) by mouth daily. (Patient taking differently: Take 0.4 mg by mouth every evening.) 30 capsule 0 Taking Differently   vitamin B-12 (CYANOCOBALAMIN ) 500 MCG tablet Take 500 mcg by mouth in the morning.   Taking   Facility-Administered Medications Ordered in Other Encounters  Medication Dose Route Frequency Provider Last Rate Last Admin   sodium chloride  irrigation 0.9 %    PRN Vernetta Lonni GRADE, MD   1,000 mL at 08/24/20 1252   No Known Allergies  Social History   Tobacco Use   Smoking status: Former   Smokeless tobacco: Never  Substance Use Topics   Alcohol use: No    No family history on file.   Review of Systems  Objective:  Physical Exam Vitals reviewed.  Constitutional:      Appearance: Normal appearance. He is normal weight.  HENT:     Head: Normocephalic and atraumatic.  Eyes:     Extraocular Movements: Extraocular movements intact.     Pupils: Pupils are equal, round, and reactive to light.  Cardiovascular:     Rate and Rhythm: Normal rate and regular rhythm.     Pulses: Normal pulses.  Pulmonary:     Effort:  Pulmonary effort is normal.     Breath sounds: Normal breath sounds.  Abdominal:     Palpations: Abdomen is soft.  Musculoskeletal:     Cervical back: Normal range of motion and neck supple.     Left knee: Effusion, bony tenderness and crepitus present. Decreased range of motion. Tenderness present over the medial joint line and lateral joint line. Abnormal alignment and abnormal meniscus.  Neurological:     Mental Status: He is alert and oriented to person, place, and time.  Psychiatric:        Behavior: Behavior normal.     Vital signs in last 24 hours:    Labs:   Estimated body mass index is 29.7 kg/m as calculated from the following:   Height as of 03/06/23: 6' (1.829 m).   Weight as of 03/06/23: 99.3 kg.   Imaging Review Plain radiographs demonstrate severe degenerative joint disease of the left knee(s). The overall alignment isneutral. The bone quality appears to be excellent for age and reported activity level.      Assessment/Plan:  End stage arthritis, left knee   The patient history, physical examination, clinical judgment of the provider and imaging studies are consistent with end stage degenerative joint disease of the left knee(s) and total knee arthroplasty is deemed medically necessary. The treatment options including medical management, injection therapy arthroscopy and arthroplasty were discussed at length. The risks and benefits of total knee arthroplasty were presented and reviewed. The risks due to aseptic loosening, infection, stiffness, patella tracking problems, thromboembolic complications and other imponderables were discussed. The patient acknowledged the explanation, agreed to proceed with the plan and consent was signed. Patient is being admitted for inpatient treatment for surgery, pain control, PT, OT, prophylactic antibiotics, VTE prophylaxis, progressive ambulation and ADL's and discharge planning. The patient is planning to be discharged home with  home health services

## 2023-03-12 NOTE — Anesthesia Preprocedure Evaluation (Addendum)
 Anesthesia Evaluation  Patient identified by MRN, date of birth, ID band Patient awake    Reviewed: Allergy & Precautions, NPO status , Patient's Chart, lab work & pertinent test results  History of Anesthesia Complications Negative for: history of anesthetic complications  Airway Mallampati: II  TM Distance: >3 FB Neck ROM: Full    Dental no notable dental hx.    Pulmonary former smoker   Pulmonary exam normal        Cardiovascular hypertension, Pt. on medications Normal cardiovascular exam     Neuro/Psych negative neurological ROS  negative psych ROS   GI/Hepatic Neg liver ROS,GERD  Medicated,,  Endo/Other  negative endocrine ROS    Renal/GU negative Renal ROS  negative genitourinary   Musculoskeletal  (+) Arthritis ,    Abdominal   Peds  Hematology negative hematology ROS (+)   Anesthesia Other Findings Day of surgery medications reviewed with patient.  Reproductive/Obstetrics negative OB ROS                             Anesthesia Physical Anesthesia Plan  ASA: 2  Anesthesia Plan: Spinal   Post-op Pain Management: Tylenol  PO (pre-op)* and Regional block*   Induction:   PONV Risk Score and Plan: 2 and Treatment may vary due to age or medical condition, Ondansetron , Propofol  infusion, Dexamethasone  and Midazolam   Airway Management Planned: Natural Airway and Simple Face Mask  Additional Equipment: None  Intra-op Plan:   Post-operative Plan:   Informed Consent: I have reviewed the patients History and Physical, chart, labs and discussed the procedure including the risks, benefits and alternatives for the proposed anesthesia with the patient or authorized representative who has indicated his/her understanding and acceptance.       Plan Discussed with: CRNA  Anesthesia Plan Comments:        Anesthesia Quick Evaluation

## 2023-03-13 ENCOUNTER — Observation Stay (HOSPITAL_COMMUNITY): Payer: BC Managed Care – PPO

## 2023-03-13 ENCOUNTER — Ambulatory Visit (HOSPITAL_COMMUNITY): Payer: BC Managed Care – PPO | Admitting: Anesthesiology

## 2023-03-13 ENCOUNTER — Other Ambulatory Visit: Payer: Self-pay

## 2023-03-13 ENCOUNTER — Encounter (HOSPITAL_COMMUNITY)
Admission: RE | Disposition: A | Discharge status: Home Health | Payer: Self-pay | Source: Home / Self Care | Attending: Orthopaedic Surgery

## 2023-03-13 ENCOUNTER — Observation Stay (HOSPITAL_COMMUNITY)
Admission: RE | Admit: 2023-03-13 | Discharge: 2023-03-14 | Disposition: A | Discharge status: Home Health | Payer: BC Managed Care – PPO | Attending: Orthopaedic Surgery | Admitting: Orthopaedic Surgery

## 2023-03-13 ENCOUNTER — Encounter (HOSPITAL_COMMUNITY): Payer: Self-pay | Admitting: Orthopaedic Surgery

## 2023-03-13 DIAGNOSIS — Z96652 Presence of left artificial knee joint: Secondary | ICD-10-CM

## 2023-03-13 DIAGNOSIS — Z79899 Other long term (current) drug therapy: Secondary | ICD-10-CM | POA: Diagnosis not present

## 2023-03-13 DIAGNOSIS — M1712 Unilateral primary osteoarthritis, left knee: Secondary | ICD-10-CM

## 2023-03-13 DIAGNOSIS — I10 Essential (primary) hypertension: Secondary | ICD-10-CM | POA: Diagnosis not present

## 2023-03-13 DIAGNOSIS — Z87891 Personal history of nicotine dependence: Secondary | ICD-10-CM | POA: Diagnosis not present

## 2023-03-13 DIAGNOSIS — Z96642 Presence of left artificial hip joint: Secondary | ICD-10-CM | POA: Insufficient documentation

## 2023-03-13 HISTORY — PX: TOTAL KNEE ARTHROPLASTY: SHX125

## 2023-03-13 SURGERY — ARTHROPLASTY, KNEE, TOTAL
Anesthesia: Spinal | Site: Knee | Laterality: Left

## 2023-03-13 MED ORDER — CLONIDINE HCL (ANALGESIA) 100 MCG/ML EP SOLN
EPIDURAL | Status: DC | PRN
  Administered 2023-03-13: 100 ug

## 2023-03-13 MED ORDER — PROPOFOL 10 MG/ML IV BOLUS
INTRAVENOUS | Status: AC
  Filled 2023-03-13: qty 20

## 2023-03-13 MED ORDER — MIDAZOLAM HCL 2 MG/2ML IJ SOLN
INTRAMUSCULAR | Status: AC
  Filled 2023-03-13: qty 2

## 2023-03-13 MED ORDER — PROPOFOL 500 MG/50ML IV EMUL
INTRAVENOUS | Status: DC | PRN
  Administered 2023-03-13: 50 ug/kg/min via INTRAVENOUS

## 2023-03-13 MED ORDER — OXYCODONE HCL 5 MG PO TABS
10.0000 mg | ORAL_TABLET | ORAL | Status: DC | PRN
  Administered 2023-03-13: 10 mg via ORAL
  Administered 2023-03-14 (×3): 15 mg via ORAL
  Filled 2023-03-13: qty 3
  Filled 2023-03-13 (×2): qty 2
  Filled 2023-03-13 (×2): qty 3

## 2023-03-13 MED ORDER — SODIUM CHLORIDE 0.9 % IR SOLN
Status: DC | PRN
  Administered 2023-03-13: 1000 mL

## 2023-03-13 MED ORDER — BUPIVACAINE-EPINEPHRINE 0.25% -1:200000 IJ SOLN
INTRAMUSCULAR | Status: DC | PRN
  Administered 2023-03-13: 30 mL

## 2023-03-13 MED ORDER — DOCUSATE SODIUM 100 MG PO CAPS
100.0000 mg | ORAL_CAPSULE | Freq: Two times a day (BID) | ORAL | Status: DC
  Administered 2023-03-13 – 2023-03-14 (×2): 100 mg via ORAL
  Filled 2023-03-13 (×2): qty 1

## 2023-03-13 MED ORDER — ASPIRIN 81 MG PO CHEW
81.0000 mg | CHEWABLE_TABLET | Freq: Two times a day (BID) | ORAL | Status: DC
  Administered 2023-03-13 – 2023-03-14 (×2): 81 mg via ORAL
  Filled 2023-03-13 (×2): qty 1

## 2023-03-13 MED ORDER — METHOCARBAMOL 500 MG PO TABS
ORAL_TABLET | ORAL | Status: AC
  Filled 2023-03-13: qty 1

## 2023-03-13 MED ORDER — PROPOFOL 10 MG/ML IV BOLUS
INTRAVENOUS | Status: DC | PRN
  Administered 2023-03-13 (×2): 10 mg via INTRAVENOUS

## 2023-03-13 MED ORDER — OXYCODONE HCL 5 MG PO TABS
ORAL_TABLET | ORAL | Status: AC
  Filled 2023-03-13: qty 1

## 2023-03-13 MED ORDER — ONDANSETRON HCL 4 MG/2ML IJ SOLN
INTRAMUSCULAR | Status: AC
  Filled 2023-03-13: qty 2

## 2023-03-13 MED ORDER — MIDAZOLAM HCL 2 MG/2ML IJ SOLN
1.0000 mg | INTRAMUSCULAR | Status: DC
  Administered 2023-03-13: 2 mg via INTRAVENOUS
  Filled 2023-03-13: qty 2

## 2023-03-13 MED ORDER — BUPIVACAINE-EPINEPHRINE 0.25% -1:200000 IJ SOLN
INTRAMUSCULAR | Status: AC
  Filled 2023-03-13: qty 1

## 2023-03-13 MED ORDER — PHENOL 1.4 % MT LIQD: 1.0000 | OROMUCOSAL | Status: DC | PRN

## 2023-03-13 MED ORDER — DEXAMETHASONE SODIUM PHOSPHATE 10 MG/ML IJ SOLN
INTRAMUSCULAR | Status: AC
  Filled 2023-03-13: qty 1

## 2023-03-13 MED ORDER — BUPIVACAINE IN DEXTROSE 0.75-8.25 % IT SOLN
INTRATHECAL | Status: DC | PRN
  Administered 2023-03-13: 1.6 mL via INTRATHECAL

## 2023-03-13 MED ORDER — SODIUM CHLORIDE 0.9 % IV SOLN: INTRAVENOUS | Status: DC

## 2023-03-13 MED ORDER — METHOCARBAMOL 1000 MG/10ML IJ SOLN
500.0000 mg | Freq: Four times a day (QID) | INTRAMUSCULAR | Status: DC | PRN
Start: 2023-03-13 — End: 2023-03-14

## 2023-03-13 MED ORDER — LACTATED RINGERS IV SOLN: INTRAVENOUS | Status: DC

## 2023-03-13 MED ORDER — CHLORHEXIDINE GLUCONATE 0.12 % MT SOLN
15.0000 mL | Freq: Once | OROMUCOSAL | Status: AC
  Administered 2023-03-13: 15 mL via OROMUCOSAL

## 2023-03-13 MED ORDER — DROPERIDOL 2.5 MG/ML IJ SOLN: 0.6250 mg | Freq: Once | INTRAMUSCULAR | Status: DC | PRN

## 2023-03-13 MED ORDER — HYDROMORPHONE HCL 1 MG/ML IJ SOLN: 0.2500 mg | INTRAMUSCULAR | Status: DC | PRN

## 2023-03-13 MED ORDER — ALUM & MAG HYDROXIDE-SIMETH 200-200-20 MG/5ML PO SUSP: 30.0000 mL | ORAL | Status: DC | PRN

## 2023-03-13 MED ORDER — STERILE WATER FOR IRRIGATION IR SOLN
Status: DC | PRN
  Administered 2023-03-13: 1000 mL

## 2023-03-13 MED ORDER — CEFAZOLIN SODIUM-DEXTROSE 2-4 GM/100ML-% IV SOLN
2.0000 g | Freq: Four times a day (QID) | INTRAVENOUS | Status: AC
  Administered 2023-03-13 (×2): 2 g via INTRAVENOUS
  Filled 2023-03-13 (×2): qty 100

## 2023-03-13 MED ORDER — TRANEXAMIC ACID-NACL 1000-0.7 MG/100ML-% IV SOLN
1000.0000 mg | INTRAVENOUS | Status: AC
  Administered 2023-03-13: 1000 mg via INTRAVENOUS
  Filled 2023-03-13: qty 100

## 2023-03-13 MED ORDER — 0.9 % SODIUM CHLORIDE (POUR BTL) OPTIME
TOPICAL | Status: DC | PRN
  Administered 2023-03-13: 1000 mL

## 2023-03-13 MED ORDER — ONDANSETRON HCL 4 MG/2ML IJ SOLN: 4.0000 mg | Freq: Four times a day (QID) | INTRAMUSCULAR | Status: DC | PRN

## 2023-03-13 MED ORDER — PANTOPRAZOLE SODIUM 40 MG PO TBEC
40.0000 mg | DELAYED_RELEASE_TABLET | Freq: Every day | ORAL | Status: DC
  Administered 2023-03-13 – 2023-03-14 (×2): 40 mg via ORAL
  Filled 2023-03-13 (×2): qty 1

## 2023-03-13 MED ORDER — EPHEDRINE SULFATE-NACL 50-0.9 MG/10ML-% IV SOSY
PREFILLED_SYRINGE | INTRAVENOUS | Status: DC | PRN
  Administered 2023-03-13: 5 mg via INTRAVENOUS

## 2023-03-13 MED ORDER — PHENYLEPHRINE HCL-NACL 20-0.9 MG/250ML-% IV SOLN
INTRAVENOUS | Status: DC | PRN
  Administered 2023-03-13: 20 ug/min via INTRAVENOUS

## 2023-03-13 MED ORDER — ONDANSETRON HCL 4 MG/2ML IJ SOLN
INTRAMUSCULAR | Status: DC | PRN
  Administered 2023-03-13: 4 mg via INTRAVENOUS

## 2023-03-13 MED ORDER — AMLODIPINE BESYLATE 10 MG PO TABS: 10.0000 mg | ORAL_TABLET | Freq: Every day | ORAL | Status: DC

## 2023-03-13 MED ORDER — MENTHOL 3 MG MT LOZG: 1.0000 | LOZENGE | OROMUCOSAL | Status: DC | PRN

## 2023-03-13 MED ORDER — METHOCARBAMOL 500 MG PO TABS
500.0000 mg | ORAL_TABLET | Freq: Four times a day (QID) | ORAL | Status: DC | PRN
  Administered 2023-03-13 (×2): 500 mg via ORAL
  Filled 2023-03-13: qty 1

## 2023-03-13 MED ORDER — DEXAMETHASONE SODIUM PHOSPHATE 10 MG/ML IJ SOLN
INTRAMUSCULAR | Status: DC | PRN
  Administered 2023-03-13: 10 mg via INTRAVENOUS

## 2023-03-13 MED ORDER — HYDROMORPHONE HCL 1 MG/ML IJ SOLN
0.5000 mg | INTRAMUSCULAR | Status: DC | PRN
Start: 2023-03-13 — End: 2023-03-14
  Administered 2023-03-13: 1 mg via INTRAVENOUS

## 2023-03-13 MED ORDER — ONDANSETRON HCL 4 MG PO TABS
4.0000 mg | ORAL_TABLET | Freq: Four times a day (QID) | ORAL | Status: DC | PRN
  Administered 2023-03-13 – 2023-03-14 (×2): 4 mg via ORAL
  Filled 2023-03-13 (×2): qty 1

## 2023-03-13 MED ORDER — VITAMIN D3 125 MCG (5000 UT) PO TABS: 5000.0000 [IU] | ORAL_TABLET | Freq: Every evening | ORAL | Status: DC

## 2023-03-13 MED ORDER — METOCLOPRAMIDE HCL 5 MG PO TABS: 5.0000 mg | ORAL_TABLET | Freq: Three times a day (TID) | ORAL | Status: DC | PRN

## 2023-03-13 MED ORDER — BENAZEPRIL HCL 20 MG PO TABS
40.0000 mg | ORAL_TABLET | Freq: Every day | ORAL | Status: DC
  Administered 2023-03-13: 40 mg via ORAL
  Filled 2023-03-13: qty 2

## 2023-03-13 MED ORDER — ACETAMINOPHEN 500 MG PO TABS
1000.0000 mg | ORAL_TABLET | Freq: Once | ORAL | Status: AC
  Administered 2023-03-13: 1000 mg via ORAL

## 2023-03-13 MED ORDER — HYDROMORPHONE HCL 1 MG/ML IJ SOLN
INTRAMUSCULAR | Status: AC
  Filled 2023-03-13: qty 1

## 2023-03-13 MED ORDER — VITAMIN D 25 MCG (1000 UNIT) PO TABS: 5000.0000 [IU] | ORAL_TABLET | Freq: Every day | ORAL | Status: DC

## 2023-03-13 MED ORDER — CEFAZOLIN SODIUM-DEXTROSE 2-4 GM/100ML-% IV SOLN
2.0000 g | INTRAVENOUS | Status: AC
  Administered 2023-03-13: 2 g via INTRAVENOUS
  Filled 2023-03-13: qty 100

## 2023-03-13 MED ORDER — POVIDONE-IODINE 10 % EX SWAB: 2.0000 | Freq: Once | CUTANEOUS | Status: DC

## 2023-03-13 MED ORDER — PROPOFOL 1000 MG/100ML IV EMUL
INTRAVENOUS | Status: AC
  Filled 2023-03-13: qty 100

## 2023-03-13 MED ORDER — EPHEDRINE 5 MG/ML INJ
INTRAVENOUS | Status: AC
  Filled 2023-03-13: qty 5

## 2023-03-13 MED ORDER — BUPIVACAINE-EPINEPHRINE (PF) 0.5% -1:200000 IJ SOLN
INTRAMUSCULAR | Status: DC | PRN
  Administered 2023-03-13: 15 mL via PERINEURAL

## 2023-03-13 MED ORDER — OXYCODONE HCL 5 MG PO TABS
5.0000 mg | ORAL_TABLET | ORAL | Status: DC | PRN
  Administered 2023-03-13 (×2): 5 mg via ORAL

## 2023-03-13 MED ORDER — METOCLOPRAMIDE HCL 5 MG/ML IJ SOLN: 5.0000 mg | Freq: Three times a day (TID) | INTRAMUSCULAR | Status: DC | PRN

## 2023-03-13 MED ORDER — ACETAMINOPHEN 325 MG PO TABS
325.0000 mg | ORAL_TABLET | Freq: Four times a day (QID) | ORAL | Status: DC | PRN
Start: 2023-03-14 — End: 2023-03-14

## 2023-03-13 MED ORDER — FENTANYL CITRATE PF 50 MCG/ML IJ SOSY
50.0000 ug | PREFILLED_SYRINGE | INTRAMUSCULAR | Status: DC
  Administered 2023-03-13: 50 ug via INTRAVENOUS
  Filled 2023-03-13: qty 2

## 2023-03-13 MED ORDER — VITAMIN B-12 1000 MCG PO TABS
500.0000 ug | ORAL_TABLET | Freq: Every day | ORAL | Status: DC
  Administered 2023-03-14: 500 ug via ORAL
  Filled 2023-03-13: qty 1

## 2023-03-13 MED ORDER — ORAL CARE MOUTH RINSE: 15.0000 mL | Freq: Once | OROMUCOSAL | Status: AC

## 2023-03-13 MED ORDER — TAMSULOSIN HCL 0.4 MG PO CAPS
0.4000 mg | ORAL_CAPSULE | Freq: Every evening | ORAL | Status: DC
  Administered 2023-03-13: 0.4 mg via ORAL
  Filled 2023-03-13: qty 1

## 2023-03-13 SURGICAL SUPPLY — 54 items
BAG COUNTER SPONGE SURGICOUNT (BAG) IMPLANT
BAG ZIPLOCK 12X15 (MISCELLANEOUS) ×1 IMPLANT
BASEPLATE CMT PS KNEE F 0D LT (Joint) IMPLANT
BENZOIN TINCTURE PRP APPL 2/3 (GAUZE/BANDAGES/DRESSINGS) IMPLANT
BLADE SAG 18X100X1.27 (BLADE) ×1 IMPLANT
BLADE SURG SZ10 CARB STEEL (BLADE) IMPLANT
BNDG ELASTIC 6INX 5YD STR LF (GAUZE/BANDAGES/DRESSINGS) ×2 IMPLANT
BOWL SMART MIX CTS (DISPOSABLE) IMPLANT
CEMENT BONE R 1X40 (Cement) IMPLANT
COOLER ICEMAN CLASSIC (MISCELLANEOUS) ×1 IMPLANT
COVER SURGICAL LIGHT HANDLE (MISCELLANEOUS) ×1 IMPLANT
CUFF TRNQT CYL 34X4.125X (TOURNIQUET CUFF) ×1 IMPLANT
DRAPE INCISE IOBAN 66X45 STRL (DRAPES) ×1 IMPLANT
DRAPE U-SHAPE 47X51 STRL (DRAPES) ×1 IMPLANT
DURAPREP 26ML APPLICATOR (WOUND CARE) ×1 IMPLANT
ELECT BLADE TIP CTD 4 INCH (ELECTRODE) ×1 IMPLANT
ELECT REM PT RETURN 15FT ADLT (MISCELLANEOUS) ×1 IMPLANT
FEMORAL KNEE COMP SZ 8 STND LT (Knees) ×1 IMPLANT
FEMORAL KNEE COMP SZ 8STD LT (Knees) IMPLANT
GAUZE PAD ABD 8X10 STRL (GAUZE/BANDAGES/DRESSINGS) ×2 IMPLANT
GAUZE SPONGE 4X4 12PLY STRL (GAUZE/BANDAGES/DRESSINGS) ×1 IMPLANT
GAUZE XEROFORM 1X8 LF (GAUZE/BANDAGES/DRESSINGS) IMPLANT
GLOVE BIO SURGEON STRL SZ7.5 (GLOVE) ×1 IMPLANT
GLOVE BIOGEL PI IND STRL 8 (GLOVE) ×2 IMPLANT
GLOVE ECLIPSE 8.0 STRL XLNG CF (GLOVE) ×1 IMPLANT
GOWN STRL REUS W/ TWL XL LVL3 (GOWN DISPOSABLE) ×2 IMPLANT
HOLDER FOLEY CATH W/STRAP (MISCELLANEOUS) IMPLANT
IMMOBILIZER KNEE 20 (SOFTGOODS) ×1
IMMOBILIZER KNEE 20 THIGH 36 (SOFTGOODS) ×1 IMPLANT
KIT TURNOVER KIT A (KITS) IMPLANT
MANIFOLD NEPTUNE II (INSTRUMENTS) ×1 IMPLANT
NS IRRIG 1000ML POUR BTL (IV SOLUTION) ×1 IMPLANT
PACK TOTAL KNEE CUSTOM (KITS) ×1 IMPLANT
PAD COLD SHLDR WRAP-ON (PAD) ×1 IMPLANT
PADDING CAST COTTON 6X4 STRL (CAST SUPPLIES) ×2 IMPLANT
PIN DRILL HDLS TROCAR 75 4PK (PIN) IMPLANT
PROTECTOR NERVE ULNAR (MISCELLANEOUS) ×1 IMPLANT
SCREW FEMALE HEX FIX 25X2.5 (ORTHOPEDIC DISPOSABLE SUPPLIES) IMPLANT
SET HNDPC FAN SPRY TIP SCT (DISPOSABLE) ×1 IMPLANT
SET PAD KNEE POSITIONER (MISCELLANEOUS) ×1 IMPLANT
SPIKE FLUID TRANSFER (MISCELLANEOUS) IMPLANT
STAPLER SKIN PROX WIDE 3.9 (STAPLE) IMPLANT
STEM ARTISURF EF 12 SZ8-11 (Stem) IMPLANT
STEM POLY PAT PLY 32M KNEE (Knees) IMPLANT
STEM TIBIAL 10 8-11 EF POLY LT (Joint) IMPLANT
STRIP CLOSURE SKIN 1/2X4 (GAUZE/BANDAGES/DRESSINGS) IMPLANT
SUT MNCRL AB 4-0 PS2 18 (SUTURE) IMPLANT
SUT VIC AB 0 CT1 27XBRD ANTBC (SUTURE) ×1 IMPLANT
SUT VIC AB 1 CT1 36 (SUTURE) ×2 IMPLANT
SUT VIC AB 2-0 CT1 TAPERPNT 27 (SUTURE) ×2 IMPLANT
TOWEL GREEN STERILE FF (TOWEL DISPOSABLE) ×1 IMPLANT
TRAY FOLEY MTR SLVR 16FR STAT (SET/KITS/TRAYS/PACK) IMPLANT
WATER STERILE IRR 1000ML POUR (IV SOLUTION) ×2 IMPLANT
YANKAUER SUCT BULB TIP NO VENT (SUCTIONS) ×1 IMPLANT

## 2023-03-13 NOTE — Interval H&P Note (Signed)
 History and Physical Interval Note: The patient understands that he is here today for a left total knee replacement to treat his significant left knee pain and arthritis.  There has been no acute or interval change in his medical status.  The risks and benefits of surgery have been discussed in detail and informed consent has been obtained.  The left operative knee has been marked.  03/13/2023 9:48 AM  Gregory Howard  has presented today for surgery, with the diagnosis of osteoarthritis left knee.  The various methods of treatment have been discussed with the patient and family. After consideration of risks, benefits and other options for treatment, the patient has consented to  Procedure(s): LEFT TOTAL KNEE ARTHROPLASTY (Left) as a surgical intervention.  The patient's history has been reviewed, patient examined, no change in status, stable for surgery.  I have reviewed the patient's chart and labs.  Questions were answered to the patient's satisfaction.     [name]

## 2023-03-13 NOTE — Anesthesia Procedure Notes (Signed)
 Date/Time: 03/13/2023 10:54 AM  Performed by: Vella Gey, CRNAOxygen Delivery Method: Simple face mask

## 2023-03-13 NOTE — Anesthesia Postprocedure Evaluation (Signed)
 Anesthesia Post Note  Patient: Gregory Howard  Procedure(s) Performed: LEFT TOTAL KNEE ARTHROPLASTY (Left: Knee)     Patient location during evaluation: PACU Anesthesia Type: Spinal Level of consciousness: awake and alert Pain management: pain level controlled Vital Signs Assessment: post-procedure vital signs reviewed and stable Respiratory status: spontaneous breathing, nonlabored ventilation and respiratory function stable Cardiovascular status: blood pressure returned to baseline Postop Assessment: no apparent nausea or vomiting and spinal receding Anesthetic complications: no   No notable events documented.  Last Vitals:  Vitals:   03/13/23 1315 03/13/23 1330  BP: 107/70 100/67  Pulse: 66 (!) 59  Resp: 11 15  Temp:  36.4 C  SpO2: 97% 98%    Last Pain:  Vitals:   03/13/23 1330  TempSrc:   PainSc: 0-No pain                 Vertell Row

## 2023-03-13 NOTE — Op Note (Signed)
 Operative Note  Date of operation: 03/13/2023 Preoperative diagnosis: Left knee primary osteoarthritis Postoperative diagnosis: Same  Procedure: Left cemented total knee arthroplasty  Implants: Biomet/Zimmer persona cemented knee system Implant Name Type Inv. Item Serial No. Manufacturer Lot No. LRB No. Used Action  CEMENT BONE R 1X40 - [id number] Cement CEMENT BONE R 1X40  ZIMMER RECON(ORTH,TRAU,BIO,SG) [id number] Left 2 Implanted  BASEPLATE CMT PS KNEE F 0D LT - [id number] Joint BASEPLATE CMT PS KNEE F 0D LT  ZIMMER RECON(ORTH,TRAU,BIO,SG) [id number] Left 1 Implanted  FEMORAL KNEE COMP SZ 8 STND LT - [id number] Knees FEMORAL KNEE COMP SZ 8 STND LT  ZIMMER RECON(ORTH,TRAU,BIO,SG) [id number] Left 1 Implanted  STEM POLY PAT PLY 81M KNEE - [id number] Knees STEM POLY PAT PLY 81M KNEE  ZIMMER RECON(ORTH,TRAU,BIO,SG) [id number] Left 1 Implanted  STEM ARTISURF EF 12 SZ8-11 - [id number] Stem STEM ARTISURF EF 12 SZ8-11  ZIMMER RECON(ORTH,TRAU,BIO,SG) [id number] Left 1 Implanted   Surgeon: Lonni GRADE. Vernetta, MD Assistant: Tory Gaskins, PA-C  Anesthesia: #1 left lower extremity adductor canal block, #2 spinal, #3 local Tourniquet time: Under 1 hour EBL: Less than 100 cc Antibiotics: IV Ancef  Complications: None  Indications: The patient is a 63 year old gentleman with debilitating arthritis involving his left knee.  We have x-ray replaced his left hip before.  His left knee arthritis is not show up significantly on plain films of the MRI showed areas of full-thickness cartilage loss especially the weightbearing surface of the knee.  He been dealing chronic knee pain for some time now and has tried and failed conservative treatment.  At this point his left knee pain has become daily and it is detrimentally affected his mobility, his quality of life is active his day living.  We have recommended knee replacement he does wish to proceed as well.  We did discuss the risks of acute blood loss anemia, nerve or  vessel injury, fracture, infection, DVT, implant failure and wound healing issues.  He understands that our goals are hopefully decrease pain, improve mobility, and improve quality of life.  Procedure description: After informed consent was obtained and the appropriate left knee was marked, anesthesia obtained a left lower extremity adductor canal block in the holding room.  The patient was then brought to the operating room and set up on the operating table where spinal anesthesia was obtained.  He was then laid in supine position on the operating table and a nonsterile tractors placed around his upper left thigh.  A Foley catheter is placed.  His left thigh, knee, leg and ankle were prepped and draped with DuraPrep and sterile drapes including a sterile stockinette.  A timeout was called and he was identified as correct patient correct left knee.  We then used the Esmarch to wrap out the leg and the tourniquet was let at 3 to millimeter pressure.  With the knee extended a direct midline incision made over the patella and carried proximally distally.  Dissection was carried down to the knee joint and a medial parapatellar arthrotomy is made finding a large joint effusion.  With the knee in a flexed position we found significant cartilage wear especially in the medial aspect of his knee.  Osteophytes removed from 3 compartments as well as remnants of the ACL and medial lateral meniscus.  With the knee in a flexed position we used an extramedullary based cutting guide for making our proximal tibia cut correction of varus and valgus and a 7 degree slope.  We made this cut to take 2 mm  off the low side and we did backed this down to more millimeters.  We then used an intramedullary reference cutting guide for distal femur cut setting this for a left knee at 5 degrees externally rotated and a 10 mm of distal femoral cut.  We made this cut without difficulty and brought the knee back down to full extension and had  achieved full extension with a 10 mm extension block.  We then went back to the femur and put a femoral sizing guide based off the epicondylar axis.  Based off of this we chose a size 8 femur.  We put a 4-in-1 cutting block for a size 8 femur and made our anterior and posterior cuts followed by her chamfer cuts.  We then backed the tibia and chose a size F tibial tray for a left knee for coverage over the tibial plateau setting the rotation of the tibial tubercle and the femur.  Based off of this we did a drill hole and keel punch and we felt like his bone quality was not as solid enough for press-fit components.  We then trialed our size F left tibial tray followed by our size 8 left CR standard femur.  We placed a 10 mm medial congruent polythene insert and we are pleased with range of motion and stability without insert.  We then made a patella cut and drilled 3 holes for a size 32 patella button.  Again we are pleased initially with all range of motion and stability with those implants.  We then removed all trial instrumentation from the knee and irrigated the knee with normal saline solution.  We placed Marcaine  with epinephrine  around the arthrotomy.  Next we mixed our cement with the knee in a flexed position we cemented our Biomet/Zimmer persona tibial tray for a left knee size F followed by cementing our size 8 CR left standard femur.  We placed our 10 mm thickness medial congruent polyethylene insert and cemented our patella button size 32.  We then held the knee extended and compressed while the cement hardened.  Once it hardened I put him through his range of motion again and I felt like there was just a little bit of play in the knee and I would feel more comfortable with increasing to a size 12 mm thickness insert so I did remove the 10 mm insert we placed a new 12 mm thickness medial congruent left polythene insert and I was definitely please range of motion and stability without insert.  We then let  the tourniquet down and hemostasis was obtained with electrocautery.  The arthrotomy was closed with interrupted #1 Vicryl suture followed by 0 Vicryl close the deep tissue and 2-0 Vicryl to close subcutaneous tissue.  The skin was closed with staples.  Well-padded sterile dressings applied.  The patient was taken to the recovery room in stable condition.  Tory Gaskins, PA-C did assist during the entire case and beginning to end and his assistance was crucial and medically necessary for soft tissue management and retraction, helping guide implant placement and a layered closure of the wound.

## 2023-03-13 NOTE — Transfer of Care (Signed)
 Immediate Anesthesia Transfer of Care Note  Patient: Reyan Helle  Procedure(s) Performed: LEFT TOTAL KNEE ARTHROPLASTY (Left: Knee)  Patient Location: PACU  Anesthesia Type:Spinal  Level of Consciousness: drowsy  Airway & Oxygen Therapy: Patient Spontanous Breathing and Patient connected to face mask oxygen  Post-op Assessment: Report given to RN and Post -op Vital signs reviewed and stable  Post vital signs: Reviewed and stable  Last Vitals:  Vitals Value Taken Time  BP 112/73 03/13/23 1248  Temp    Pulse 67 03/13/23 1249  Resp 14 03/13/23 1249  SpO2 100 % 03/13/23 1249  Vitals shown include unfiled device data.  Last Pain:  Vitals:   03/13/23 1045  TempSrc:   PainSc: 0-No pain         Complications: No notable events documented.

## 2023-03-13 NOTE — Anesthesia Procedure Notes (Signed)
 Spinal  Patient location during procedure: OR Start time: 03/13/2023 10:55 AM End time: 03/13/2023 10:58 AM Reason for block: surgical anesthesia Staffing Performed: anesthesiologist  Anesthesiologist: Paul Lamarr BRAVO, MD Performed by: Paul Lamarr BRAVO, MD Authorized by: Paul Lamarr BRAVO, MD   Preanesthetic Checklist Completed: patient identified, IV checked, risks and benefits discussed, surgical consent, monitors and equipment checked, pre-op evaluation and timeout performed Spinal Block Patient position: sitting Prep: DuraPrep and site prepped and draped Patient monitoring: continuous pulse ox, blood pressure and heart rate Approach: midline Location: L3-4 Injection technique: single-shot Needle Needle type: Pencan  Needle gauge: 24 G Needle length: 9 cm Assessment Events: CSF return Additional Notes Risks, benefits, and alternative discussed. Patient gave consent to procedure. Prepped and draped in sitting position. Patient sedated but responsive to voice. Clear CSF obtained after one needle pass. Positive terminal aspiration. No pain or paraesthesias with injection. Patient tolerated procedure well. Vital signs stable. LANEY Paul, MD

## 2023-03-13 NOTE — Discharge Instructions (Signed)
 Per [facility] clinic policy, our goal is ensure optimal postoperative pain control with a multimodal pain management strategy. For all [facility] patients, our goal is to wean post-operative narcotic medications by 6 weeks post-operatively. If this is not possible due to utilization of pain medication prior to surgery, your [facility] doctor will support your acute post-operative pain control for the first 6 weeks postoperatively, with a plan to transition you back to your primary pain team following that. Gregory Howard will work to ensure a Therapist, occupational.  INSTRUCTIONS AFTER JOINT REPLACEMENT   Remove items at home which could result in a fall. This includes throw rugs or furniture in walking pathways ICE to the affected joint every three hours while awake for 30 minutes at a time, for at least the first 3-5 days, and then as needed for pain and swelling.  Continue to use ice for pain and swelling. You may notice swelling that will progress down to the foot and ankle.  This is normal after surgery.  Elevate your leg when you are not up walking on it.   Continue to use the breathing machine you got in the hospital (incentive spirometer) which will help keep your temperature down.  It is common for your temperature to cycle up and down following surgery, especially at night when you are not up moving around and exerting yourself.  The breathing machine keeps your lungs expanded and your temperature down.   DIET:  As you were doing prior to hospitalization, we recommend a well-balanced diet.  DRESSING / WOUND CARE / SHOWERING  Keep the surgical dressing until follow up.  The dressing is water  proof, so you can shower without any extra covering.  IF THE DRESSING FALLS OFF or the wound gets wet inside, change the dressing with sterile gauze.  Please use good hand washing techniques before changing the dressing.  Do not use any lotions or creams on the incision until instructed by your surgeon.     ACTIVITY  Increase activity slowly as tolerated, but follow the weight bearing instructions below.   No driving for 6 weeks or until further direction given by your physician.  You cannot drive while taking narcotics.  No lifting or carrying greater than 10 lbs. until further directed by your surgeon. Avoid periods of inactivity such as sitting longer than an hour when not asleep. This helps prevent blood clots.  You may return to work once you are authorized by your doctor.     WEIGHT BEARING   Weight bearing as tolerated with assist device (walker, cane, etc) as directed, use it as long as suggested by your surgeon or therapist, typically at least 4-6 weeks.   EXERCISES  Results after joint replacement surgery are often greatly improved when you follow the exercise, range of motion and muscle strengthening exercises prescribed by your doctor. Safety measures are also important to protect the joint from further injury. Any time any of these exercises cause you to have increased pain or swelling, decrease what you are doing until you are comfortable again and then slowly increase them. If you have problems or questions, call your caregiver or physical therapist for advice.   Rehabilitation is important following a joint replacement. After just a few days of immobilization, the muscles of the leg can become weakened and shrink (atrophy).  These exercises are designed to build up the tone and strength of the thigh and leg muscles and to improve motion. Often times heat used for twenty to thirty minutes before  working out will loosen up your tissues and help with improving the range of motion but do not use heat for the first two weeks following surgery (sometimes heat can increase post-operative swelling).   These exercises can be done on a training (exercise) mat, on the floor, on a table or on a bed. Use whatever works the best and is most comfortable for you.    Use music or television  while you are exercising so that the exercises are a pleasant break in your day. This will make your life better with the exercises acting as a break in your routine that you can look forward to.   Perform all exercises about fifteen times, three times per day or as directed.  You should exercise both the operative leg and the other leg as well.  Exercises include:   Quad Sets - Tighten up the muscle on the front of the thigh (Quad) and hold for 5-10 seconds.   Straight Leg Raises - With your knee straight (if you were given a brace, keep it on), lift the leg to 60 degrees, hold for 3 seconds, and slowly lower the leg.  Perform this exercise against resistance later as your leg gets stronger.  Leg Slides: Lying on your back, slowly slide your foot toward your buttocks, bending your knee up off the floor (only go as far as is comfortable). Then slowly slide your foot back down until your leg is flat on the floor again.  Angel Wings: Lying on your back spread your legs to the side as far apart as you can without causing discomfort.  Hamstring Strength:  Lying on your back, push your heel against the floor with your leg straight by tightening up the muscles of your buttocks.  Repeat, but this time bend your knee to a comfortable angle, and push your heel against the floor.  You may put a pillow under the heel to make it more comfortable if necessary.   A rehabilitation program following joint replacement surgery can speed recovery and prevent re-injury in the future due to weakened muscles. Contact your doctor or a physical therapist for more information on knee rehabilitation.    CONSTIPATION  Constipation is defined medically as fewer than three stools per week and severe constipation as less than one stool per week.  Even if you have a regular bowel pattern at home, your normal regimen is likely to be disrupted due to multiple reasons following surgery.  Combination of anesthesia, postoperative  narcotics, change in appetite and fluid intake all can affect your bowels.   YOU MUST use at least one of the following options; they are listed in order of increasing strength to get the job done.  They are all available over the counter, and you may need to use some, POSSIBLY even all of these options:    Drink plenty of fluids (prune juice may be helpful) and high fiber foods Colace 100 mg by mouth twice a day  Senokot for constipation as directed and as needed Dulcolax (bisacodyl), take with full glass of water  Miralax (polyethylene glycol) once or twice a day as needed.  If you have tried all these things and are unable to have a bowel movement in the first 3-4 days after surgery call either your surgeon or your primary doctor.    If you experience loose stools or diarrhea, hold the medications until you stool forms back up.  If your symptoms do not get better within 1 week  or if they get worse, check with your doctor.  If you experience the worst abdominal pain ever or develop nausea or vomiting, please contact the office immediately for further recommendations for treatment.   ITCHING:  If you experience itching with your medications, try taking only a single pain pill, or even half a pain pill at a time.  You can also use Benadryl  over the counter for itching or also to help with sleep.   TED HOSE STOCKINGS:  Use stockings on both legs until for at least 2 weeks or as directed by physician office. They may be removed at night for sleeping.  MEDICATIONS:  See your medication summary on the "After Visit Summary" that nursing will review with you.  You may have some home medications which will be placed on hold until you complete the course of blood thinner medication.  It is important for you to complete the blood thinner medication as prescribed.  PRECAUTIONS:  If you experience chest pain or shortness of breath - call 911 immediately for transfer to the hospital emergency department.    If you develop a fever greater that 101 F, purulent drainage from wound, increased redness or drainage from wound, foul odor from the wound/dressing, or calf pain - CONTACT YOUR SURGEON.                                                   FOLLOW-UP APPOINTMENTS:  If you do not already have a post-op appointment, please call the office for an appointment to be seen by your surgeon.  Guidelines for how soon to be seen are listed in your "After Visit Summary", but are typically between 1-4 weeks after surgery.  OTHER INSTRUCTIONS:   Knee Replacement:  Do not place pillow under knee, focus on keeping the knee straight while resting. CPM instructions: 0-90 degrees, 2 hours in the morning, 2 hours in the afternoon, and 2 hours in the evening. Place foam block, curve side up under heel at all times except when in CPM or when walking.  DO NOT modify, tear, cut, or change the foam block in any way.  POST-OPERATIVE OPIOID TAPER INSTRUCTIONS: It is important to wean off of your opioid medication as soon as possible. If you do not need pain medication after your surgery it is ok to stop day one. Opioids include: Codeine, Hydrocodone(Norco, Vicodin), Oxycodone (Percocet, oxycontin ) and hydromorphone  amongst others.  Long term and even short term use of opiods can cause: Increased pain response Dependence Constipation Depression Respiratory depression And more.  Withdrawal symptoms can include Flu like symptoms Nausea, vomiting And more Techniques to manage these symptoms Hydrate well Eat regular healthy meals Stay active Use relaxation techniques(deep breathing, meditating, yoga) Do Not substitute Alcohol to help with tapering If you have been on opioids for less than two weeks and do not have pain than it is ok to stop all together.  Plan to wean off of opioids This plan should start within one week post op of your joint replacement. Maintain the same interval or time between taking each dose  and first decrease the dose.  Cut the total daily intake of opioids by one tablet each day Next start to increase the time between doses. The last dose that should be eliminated is the evening dose.   MAKE SURE YOU:  Understand these instructions.  Get help right away if you are not doing well or get worse.    Thank you for letting us  be a part of your medical care team.  It is a privilege we respect greatly.  We hope these instructions will help you stay on track for a fast and full recovery!     Dental Antibiotics:  In most cases prophylactic antibiotics for Dental procdeures after total joint surgery are not necessary.  Exceptions are as follows:  1. History of prior total joint infection  2. Severely immunocompromised (Organ Transplant, cancer chemotherapy, Rheumatoid biologic meds such as Humera)  3. Poorly controlled diabetes (A1C &gt; 8.0, blood glucose over 200)  If you have one of these conditions, contact your surgeon for an antibiotic prescription, prior to your dental procedure.

## 2023-03-13 NOTE — Evaluation (Signed)
 Physical Therapy Evaluation Patient Details Name: Gregory Howard MRN: [id number] DOB: 1960-09-17 Today's Date: 03/13/2023  History of Present Illness  Patient is 63 y.o. male presenting to therapy s/p L TKA on 03/12/2022 due to failure of conservative measures. Pt PMH significant for OA, kidney stones, HTN and L THA anterior approach in 2022.  Clinical Impression   Gregory Howard is a 63 y.o. male POD 0 s/p L TKA. Patient reports IND with mobility at baseline. Patient is now limited by functional impairments (see PT problem list below) and requires S for bed mobility and CGA and cues for transfers. Patient was able to ambulate 50 feet with RW and CGA level of assist. Patient instructed in exercise to facilitate ROM and circulation to manage edema. Pt left semi reclined in recliner and all needs in place, with nurse administering pain medication after PT eval. Patient will benefit from continued skilled PT interventions to address impairments and progress towards PLOF. Acute PT will follow to progress mobility and stair training in preparation for safe discharge home with family support and [facility] services. Pt feeling light headed s/p gait bp 108/61 (57 PR)        If plan is discharge home, recommend the following: A little help with walking and/or transfers;A little help with bathing/dressing/bathroom;Assistance with cooking/housework;Assist for transportation;Help with stairs or ramp for entrance   Can travel by private vehicle        Equipment Recommendations None recommended by PT  Recommendations for Other Services       Functional Status Assessment Patient has had a recent decline in their functional status and demonstrates the ability to make significant improvements in function in a reasonable and predictable amount of time.     Precautions / Restrictions Precautions Precautions: Knee;Fall Restrictions Weight Bearing Restrictions Per Provider Order: No LLE Weight Bearing Per Provider  Order: Weight bearing as tolerated      Mobility  Bed Mobility Overal bed mobility: Needs Assistance Bed Mobility: Supine to Sit     Supine to sit: Supervision, HOB elevated, Used rails     General bed mobility comments: min cues    Transfers Overall transfer level: Needs assistance Equipment used: Rolling walker (2 wheels) Transfers: Sit to/from Stand Sit to Stand: Contact guard assist           General transfer comment: min cues pt able to push to stand    Ambulation/Gait Ambulation/Gait assistance: Contact guard assist Gait Distance (Feet): 50 Feet Assistive device: Rolling walker (2 wheels) Gait Pattern/deviations: Step-to pattern, Antalgic, Trunk flexed Gait velocity: decreased     General Gait Details: slight trunk flexion with B UE support at RW to offload L LE in stance phase with min cues for safety and sequencing  Stairs            Wheelchair Mobility     Tilt Bed    Modified Rankin (Stroke Patients Only)       Balance Overall balance assessment: Needs assistance Sitting-balance support: Feet supported Sitting balance-Leahy Scale: Good     Standing balance support: Bilateral upper extremity supported, During functional activity, Reliant on assistive device for balance Standing balance-Leahy Scale: Poor                               Pertinent Vitals/Pain Pain Assessment Pain Assessment: 0-10 Pain Score: 8  Pain Location: L knee Pain Descriptors / Indicators: Aching, Constant, Discomfort, Grimacing, Operative site guarding, Throbbing, Tightness  Pain Intervention(s): Limited activity within patient's tolerance, Monitored during session, Premedicated before session, Repositioned, Patient requesting pain meds-RN notified, RN gave pain meds during session, Ice applied (pain meds provided at end of Eval)    Home Living Family/patient expects to be discharged to:: Private residence Living Arrangements: Spouse/significant  other Available Help at Discharge: Family Type of Home: House Home Access: Stairs to enter Entrance Stairs-Rails: None Entrance Stairs-Number of Steps: 3   Home Layout: One level Home Equipment: [employer] (2 wheels)      Prior Function Prior Level of Function : Independent/Modified Independent;Working/employed;Driving             Mobility Comments: IND no AD for all ADLs, self care tasks and IADLs       Extremity/Trunk Assessment        Lower Extremity Assessment Lower Extremity Assessment: LLE deficits/detail LLE Deficits / Details: ankle DF/PF 5/5; SLR < 10 degree lag LLE Sensation: WNL    Cervical / Trunk Assessment Cervical / Trunk Assessment: Normal  Communication   Communication Communication: No apparent difficulties  Cognition Arousal: Alert Behavior During Therapy: WFL for tasks assessed/performed Overall Cognitive Status: Within Functional Limits for tasks assessed                                          General Comments      Exercises Total Joint Exercises Ankle Circles/Pumps: AROM, Both, 10 reps   Assessment/Plan    PT Assessment Patient needs continued PT services  PT Problem List Decreased strength;Decreased range of motion;Decreased activity tolerance;Decreased balance;Decreased mobility;Decreased coordination;Pain       PT Treatment Interventions DME instruction;Gait training;Stair training;Functional mobility training;Therapeutic activities;Therapeutic exercise;Balance training;Neuromuscular re-education;Patient/family education;Modalities    PT Goals (Current goals can be found in the Care Plan section)  Acute Rehab PT Goals Patient Stated Goal: to be able to walk for fitness PT Goal Formulation: With patient Time For Goal Achievement: 03/27/23 Potential to Achieve Goals: Good    Frequency 7X/week     Co-evaluation               AM-PAC PT 6 Clicks Mobility  Outcome Measure Help needed turning  from your back to your side while in a flat bed without using bedrails?: A Little Help needed moving from lying on your back to sitting on the side of a flat bed without using bedrails?: A Little Help needed moving to and from a bed to a chair (including a wheelchair)?: A Little Help needed standing up from a chair using your arms (e.g., wheelchair or bedside chair)?: A Little Help needed to walk in hospital room?: A Little Help needed climbing 3-5 steps with a railing? : A Lot 6 Click Score: 17    End of Session Equipment Utilized During Treatment: Gait belt Activity Tolerance: Treatment limited secondary to medical complications (Comment);Patient limited by pain (light headedness) Patient left: in chair;with call bell/phone within reach Nurse Communication: Mobility status PT Visit Diagnosis: Unsteadiness on feet (R26.81);Other abnormalities of gait and mobility (R26.89);Muscle weakness (generalized) (M62.81);Difficulty in walking, not elsewhere classified (R26.2);Pain Pain - Right/Left: Left Pain - part of body: Knee;Leg    Time: 8178-8151 PT Time Calculation (min) (ACUTE ONLY): 27 min   Charges:   PT Evaluation $PT Eval Low Complexity: 1 Low PT Treatments $Gait Training: 8-22 mins PT General Charges $$ ACUTE PT VISIT: 1 Visit  [place], PT Acute Rehab   [place] VEAR Drone 03/13/2023, 6:58 PM

## 2023-03-13 NOTE — Anesthesia Procedure Notes (Signed)
 Anesthesia Regional Block: Adductor canal block   Pre-Anesthetic Checklist: , timeout performed,  Correct Patient, Correct Site, Correct Laterality,  Correct Procedure, Correct Position, site marked,  Risks and benefits discussed,  Pre-op evaluation,  At surgeon's request and post-op pain management  Laterality: Left  Prep: Maximum Sterile Barrier Precautions used, chloraprep       Needles:  Injection technique: Single-shot  Needle Type: Echogenic Stimulator Needle     Needle Length: 9cm  Needle Gauge: 22     Additional Needles:   Procedures:,,,, ultrasound used (permanent image in chart),,    Narrative:  Start time: 03/13/2023 10:44 AM End time: 03/13/2023 10:47 AM Injection made incrementally with aspirations every 5 mL.  Performed by: Personally  Anesthesiologist: Paul Lamarr BRAVO, MD  Additional Notes: Risks, benefits, and alternative discussed. Patient gave consent for procedure. Patient prepped and draped in sterile fashion. Sedation administered, patient remains easily responsive to voice. Relevant anatomy identified with ultrasound guidance. Local anesthetic given in 5cc increments with no signs or symptoms of intravascular injection. No pain or paraesthesias with injection. Patient monitored throughout procedure with signs of LAST or immediate complications. Tolerated well. Ultrasound image placed in chart.  LANEY Paul, MD

## 2023-03-14 DIAGNOSIS — M1712 Unilateral primary osteoarthritis, left knee: Secondary | ICD-10-CM | POA: Diagnosis not present

## 2023-03-14 LAB — BASIC METABOLIC PANEL
Anion gap: 8 (ref 5–15)
BUN: 26 mg/dL — ABNORMAL HIGH (ref 8–23)
CO2: 24 mmol/L (ref 22–32)
Calcium: 8.4 mg/dL — ABNORMAL LOW (ref 8.9–10.3)
Chloride: 101 mmol/L (ref 98–111)
Creatinine, Ser: 0.84 mg/dL (ref 0.61–1.24)
GFR, Estimated: >60 mL/min (ref >60)
Glucose, Bld: 176 mg/dL — ABNORMAL HIGH (ref 70–99)
Potassium: 4.4 mmol/L (ref 3.5–5.1)
Sodium: 133 mmol/L — ABNORMAL LOW (ref 135–145)

## 2023-03-14 LAB — CBC
HCT: 35.2 % — ABNORMAL LOW (ref 39.0–52.0)
Hemoglobin: 11.8 g/dL — ABNORMAL LOW (ref 13.0–17.0)
MCH: 30.1 pg (ref 26.0–34.0)
MCHC: 33.5 g/dL (ref 30.0–36.0)
MCV: 89.8 fL (ref 80.0–100.0)
Platelets: 246 10*3/uL (ref 150–400)
RBC: 3.92 MIL/uL — ABNORMAL LOW (ref 4.22–5.81)
RDW: 12.8 % (ref 11.5–15.5)
WBC: 13.1 10*3/uL — ABNORMAL HIGH (ref 4.0–10.5)
nRBC: 0 % (ref 0.0–0.2)

## 2023-03-14 MED ORDER — METHOCARBAMOL 500 MG PO TABS: 500.0000 mg | ORAL_TABLET | Freq: Four times a day (QID) | ORAL | 1 refills | Status: DC | PRN

## 2023-03-14 MED ORDER — ASPIRIN 81 MG PO CHEW: 81.0000 mg | CHEWABLE_TABLET | Freq: Two times a day (BID) | ORAL | 0 refills | Status: AC

## 2023-03-14 MED ORDER — OXYCODONE HCL 5 MG PO TABS: 5.0000 mg | ORAL_TABLET | Freq: Four times a day (QID) | ORAL | 0 refills | Status: AC | PRN

## 2023-03-14 NOTE — Progress Notes (Signed)
 Physical Therapy Treatment Patient Details Name: Gregory Howard MRN: [id number] DOB: 31-Jan-1961 Today's Date: 03/14/2023   History of Present Illness Patient is 63 y.o. male presenting to therapy s/p L TKA on 03/12/2022 due to failure of conservative measures. Pt PMH significant for OA, kidney stones, HTN and L THA anterior approach in 2022.    PT Comments  Pt agreeable to working with therapy. Progressing with mobility. Plan is for d/c home later today with, HHPT f/u, after stair training.    If plan is discharge home, recommend the following: A little help with walking and/or transfers;A little help with bathing/dressing/bathroom;Assistance with cooking/housework;Assist for transportation;Help with stairs or ramp for entrance   Can travel by private vehicle        Equipment Recommendations  None recommended by PT    Recommendations for Other Services       Precautions / Restrictions Precautions Precautions: Fall;Knee Restrictions Weight Bearing Restrictions Per Provider Order: No LLE Weight Bearing Per Provider Order: Weight bearing as tolerated     Mobility  Bed Mobility Overal bed mobility: Needs Assistance Bed Mobility: Supine to Sit, Sit to Supine     Supine to sit: Supervision, HOB elevated Sit to supine: Supervision, HOB elevated   General bed mobility comments: Used gait belt as leg lifter.    Transfers Overall transfer level: Needs assistance Equipment used: Rolling walker (2 wheels) Transfers: Sit to/from Stand Sit to Stand: Supervision           General transfer comment: Cues for safety.    Ambulation/Gait Ambulation/Gait assistance: Supervision Gait Distance (Feet): 100 Feet Assistive device: Rolling walker (2 wheels) Gait Pattern/deviations: Step-through pattern, Decreased stance time - left       General Gait Details: Pt denied lightheadedness on today. Tolerated distance well. No LOB with RW use   Stairs             Wheelchair  Mobility     Tilt Bed    Modified Rankin (Stroke Patients Only)       Balance Overall balance assessment: Needs assistance         Standing balance support: During functional activity, Reliant on assistive device for balance Standing balance-Leahy Scale: Fair                              Cognition Arousal: Alert Behavior During Therapy: WFL for tasks assessed/performed Overall Cognitive Status: Within Functional Limits for tasks assessed                                          Exercises Total Joint Exercises Ankle Circles/Pumps: AROM, Both, 10 reps Quad Sets: AROM, Left, 10 reps Heel Slides: AAROM, Left, 10 reps (used gait belt to A) Hip ABduction/ADduction: AAROM, Left, 10 reps Straight Leg Raises: AAROM, Left, 10 reps Goniometric ROM: ~10-65 degrees    General Comments        Pertinent Vitals/Pain Pain Assessment Pain Assessment: 0-10 Pain Score: 7  Pain Location: L knee Pain Descriptors / Indicators: Aching, Operative site guarding, Tightness Pain Intervention(s): Limited activity within patient's tolerance, Monitored during session, Ice applied, Repositioned    Home Living                          Prior Function  PT Goals (current goals can now be found in the care plan section) Progress towards PT goals: Progressing toward goals    Frequency    7X/week      PT Plan      Co-evaluation              AM-PAC PT 6 Clicks Mobility   Outcome Measure  Help needed turning from your back to your side while in a flat bed without using bedrails?: A Little Help needed moving from lying on your back to sitting on the side of a flat bed without using bedrails?: A Little Help needed moving to and from a bed to a chair (including a wheelchair)?: A Little Help needed standing up from a chair using your arms (e.g., wheelchair or bedside chair)?: A Little Help needed to walk in hospital room?: A  Little Help needed climbing 3-5 steps with a railing? : A Lot 6 Click Score: 17    End of Session Equipment Utilized During Treatment: Gait belt Activity Tolerance: Patient tolerated treatment well Patient left: in bed;with call bell/phone within reach;with bed alarm set   Pain - Right/Left: Left Pain - part of body: Knee     Time: 9054-8987 PT Time Calculation (min) (ACUTE ONLY): 27 min  Charges:    $Gait Training: 8-22 mins $Therapeutic Exercise: 8-22 mins PT General Charges $$ ACUTE PT VISIT: 1 Visit                         Dannial SQUIBB, PT Acute Rehabilitation  Office: [phone]

## 2023-03-14 NOTE — Progress Notes (Signed)
 Physical Therapy Treatment Patient Details Name: Gregory Howard MRN: [id number] DOB: 07-Aug-1960 Today's Date: 03/14/2023   History of Present Illness Patient is 63 y.o. male presenting to therapy s/p L TKA on 03/12/2022 due to failure of conservative measures. Pt PMH significant for OA, kidney stones, HTN and L THA anterior approach in 2022.    PT Comments  2nd session to practice stair negotiation. Encouraged pt to ambulate often at home. HHPT after discharge    If plan is discharge home, recommend the following: A little help with walking and/or transfers;A little help with bathing/dressing/bathroom;Assistance with cooking/housework;Assist for transportation;Help with stairs or ramp for entrance   Can travel by private vehicle        Equipment Recommendations  None recommended by PT    Recommendations for Other Services       Precautions / Restrictions Precautions Precautions: Fall;Knee Restrictions Weight Bearing Restrictions Per Provider Order: No LLE Weight Bearing Per Provider Order: Weight bearing as tolerated     Mobility  Bed Mobility Overal bed mobility: Needs Assistance Bed Mobility: Supine to Sit, Sit to Supine     Supine to sit: Supervision, HOB elevated Sit to supine: Supervision, HOB elevated   General bed mobility comments: Used gait belt as leg lifter.    Transfers Overall transfer level: Needs assistance Equipment used: Rolling walker (2 wheels) Transfers: Sit to/from Stand Sit to Stand: Supervision           General transfer comment: Cues for safety.    Ambulation/Gait Ambulation/Gait assistance: Supervision Gait Distance (Feet): 100 Feet Assistive device: Rolling walker (2 wheels) Gait Pattern/deviations: Step-through pattern, Decreased stance time - left       General Gait Details: Tolerated distance well. No LOB with RW use   Stairs Stairs: Yes Stairs assistance: Min assist Stair Management: Forwards, Step to pattern, With  walker Number of Stairs: 2 General stair comments: up and over portable stairs x 1. cues for safety, technique, sequencing. Min A to stabilize walker.   Wheelchair Mobility     Tilt Bed    Modified Rankin (Stroke Patients Only)       Balance Overall balance assessment: Needs assistance         Standing balance support: During functional activity, Reliant on assistive device for balance Standing balance-Leahy Scale: Fair                              Cognition Arousal: Alert Behavior During Therapy: WFL for tasks assessed/performed Overall Cognitive Status: Within Functional Limits for tasks assessed                                          Exercises     General Comments        Pertinent Vitals/Pain Pain Assessment Pain Assessment: Faces Pain Score: 7  Faces Pain Scale: Hurts even more Pain Location: L knee Pain Descriptors / Indicators: Aching, Operative site guarding, Tightness Pain Intervention(s): Limited activity within patient's tolerance, Monitored during session, Repositioned, Ice applied    Home Living                          Prior Function            PT Goals (current goals can now be found in the care plan section) Progress towards PT  goals: Progressing toward goals    Frequency    7X/week      PT Plan      Co-evaluation              AM-PAC PT 6 Clicks Mobility   Outcome Measure  Help needed turning from your back to your side while in a flat bed without using bedrails?: A Little Help needed moving from lying on your back to sitting on the side of a flat bed without using bedrails?: A Little Help needed moving to and from a bed to a chair (including a wheelchair)?: A Little Help needed standing up from a chair using your arms (e.g., wheelchair or bedside chair)?: A Little Help needed to walk in hospital room?: A Little Help needed climbing 3-5 steps with a railing? : A Little 6  Click Score: 18    End of Session Equipment Utilized During Treatment: Gait belt Activity Tolerance: Patient tolerated treatment well Patient left: in bed;with call bell/phone within reach;with bed alarm set   PT Visit Diagnosis: Unsteadiness on feet (R26.81);Other abnormalities of gait and mobility (R26.89);Muscle weakness (generalized) (M62.81);Difficulty in walking, not elsewhere classified (R26.2);Pain Pain - Right/Left: Left Pain - part of body: Knee     Time: 1350-1405 PT Time Calculation (min) (ACUTE ONLY): 15 min  Charges:    $Gait Training: 8-22 mins $Therapeutic Exercise: 8-22 mins PT General Charges $$ ACUTE PT VISIT: 1 Visit                         Dannial SQUIBB, PT Acute Rehabilitation  Office: [phone]

## 2023-03-14 NOTE — Plan of Care (Signed)
   Problem: Coping: Goal: Level of anxiety will decrease Outcome: Progressing   Problem: Pain Managment: Goal: General experience of comfort will improve and/or be controlled Outcome: Progressing   Problem: Safety: Goal: Ability to remain free from injury will improve Outcome: Progressing

## 2023-03-14 NOTE — Discharge Summary (Signed)
 Patient ID: Gregory Howard MRN: [id number] DOB/AGE: [birth year] 63 y.o.  Admit date: 03/13/2023 Discharge date: 03/14/2023  Admission Diagnoses:  Principal Problem:   Unilateral primary osteoarthritis, left knee Active Problems:   Status post total left knee replacement   Discharge Diagnoses:  Same  Past Medical History:  Diagnosis Date   Arthritis    History of kidney stones    Hypertension    Kidney stones    Pre-diabetes     Surgeries: Procedure(s): LEFT TOTAL KNEE ARTHROPLASTY on 03/13/2023   Consultants:   Discharged Condition: Improved  Hospital Course: Gregory Howard is an 63 y.o. male who was admitted 03/13/2023 for operative treatment ofUnilateral primary osteoarthritis, left knee. Patient has severe unremitting pain that affects sleep, daily activities, and work/hobbies. After pre-op clearance the patient was taken to the operating room on 03/13/2023 and underwent  Procedure(s): LEFT TOTAL KNEE ARTHROPLASTY.    Patient was given perioperative antibiotics:  Anti-infectives (From admission, onward)    Start     Dose/Rate Route Frequency Ordered Stop   03/13/23 1800  ceFAZolin  (ANCEF ) IVPB 2g/100 mL premix        2 g 200 mL/hr over 30 Minutes Intravenous Every 6 hours 03/13/23 1715 03/13/23 2359   03/13/23 0900  ceFAZolin  (ANCEF ) IVPB 2g/100 mL premix        2 g 200 mL/hr over 30 Minutes Intravenous On call to O.R. 03/13/23 0850 03/13/23 1107        Patient was given sequential compression devices, early ambulation, and chemoprophylaxis to prevent DVT.  Patient benefited maximally from hospital stay and there were no complications.    Recent vital signs: Patient Vitals for the past 24 hrs:  BP Temp Temp src Pulse Resp SpO2  03/14/23 1014 118/68 97.8 F (36.6 C) Oral 63 20 99 %  03/14/23 0607 119/67 97.8 F (36.6 C) Oral 76 18 97 %  03/14/23 0122 109/65 97.7 F (36.5 C) Oral 70 14 97 %  03/13/23 2255 112/70 (!) 97.5 F (36.4 C) Oral 64 15 97 %  03/13/23  1847 108/60 -- -- -- -- --  03/13/23 1726 115/67 98 F (36.7 C) Oral 60 16 94 %  03/13/23 1630 115/67 -- -- (!) 58 15 98 %  03/13/23 1530 119/72 -- -- 63 15 98 %  03/13/23 1430 119/80 -- -- 68 18 99 %  03/13/23 1330 100/67 97.6 F (36.4 C) -- (!) 59 15 98 %  03/13/23 1315 107/70 -- -- 66 11 97 %  03/13/23 1300 110/72 -- -- 71 10 99 %  03/13/23 1248 112/73 98.4 F (36.9 C) -- 73 13 100 %     Recent laboratory studies:  Recent Labs    03/14/23 0313  WBC 13.1*  HGB 11.8*  HCT 35.2*  PLT 246  NA 133*  K 4.4  CL 101  CO2 24  BUN 26*  CREATININE 0.84  GLUCOSE 176*  CALCIUM 8.4*     Discharge Medications:   Allergies as of 03/14/2023   No Known Allergies      Medication List     TAKE these medications    acetaminophen  500 MG tablet Commonly known as: TYLENOL  Take 500 mg by mouth every 6 (six) hours as needed for moderate pain (pain score 4-6).   amLODipine  10 MG tablet Commonly known as: NORVASC  Take 10 mg by mouth in the morning.   aspirin  81 MG chewable tablet Chew 1 tablet (81 mg total) by mouth 2 (two) times daily.  benazepril  40 MG tablet Commonly known as: LOTENSIN  Take 40 mg by mouth daily.   BLACK PEPPER-TURMERIC PO Take 1 tablet by mouth daily.   cyanocobalamin  500 MCG tablet Commonly known as: VITAMIN B12 Take 500 mcg by mouth in the morning.   Fish Oil 1200 MG Caps Take 2,400 mg by mouth in the morning.   methocarbamol  500 MG tablet Commonly known as: ROBAXIN  Take 1 tablet (500 mg total) by mouth every 6 (six) hours as needed for muscle spasms.   naproxen sodium 220 MG tablet Commonly known as: ALEVE Take 220 mg by mouth daily as needed (pain).   oxyCODONE  5 MG immediate release tablet Commonly known as: Oxy IR/ROXICODONE  Take 1-2 tablets (5-10 mg total) by mouth every 6 (six) hours as needed for moderate pain (pain score 4-6) (pain score 4-6).   pantoprazole  40 MG tablet Commonly known as: PROTONIX  Take 40 mg by mouth daily.    tamsulosin  0.4 MG Caps capsule Commonly known as: Flomax  Take 1 capsule (0.4 mg total) by mouth daily. What changed: when to take this   Vitamin D3 125 MCG (5000 UT) Tabs Take 5,000 Units by mouth every evening.               Durable Medical Equipment  (From admission, onward)           Start     Ordered   03/13/23 1716  DME 3 n 1  Once        03/13/23 1715   03/13/23 1716  DME Walker rolling  Once       Question Answer Comment  Walker: With 5 Inch Wheels   Patient needs a walker to treat with the following condition Status post total left knee replacement      03/13/23 1715            Diagnostic Studies: DG Knee Left Port Result Date: 03/13/2023 CLINICAL DATA:  Status post total left knee arthroplasty. EXAM: PORTABLE LEFT KNEE - 1-2 VIEW COMPARISON:  Left knee radiographs 07/28/2022 FINDINGS: Interval total left knee arthroplasty. No perihardware lucency is seen to indicate hardware failure or loosening. Expected postoperative changes including intra-articular and subcutaneous air. Smalljoint effusion. Anterior surgical skin staples. Mild chronic enthesopathic change at the quadriceps insertion on the patella. No acute fracture or dislocation. IMPRESSION: Interval total left knee arthroplasty without evidence of hardware failure. Electronically Signed   By: Tanda Lyons M.D.   On: 03/13/2023 14:41    Disposition: Discharge disposition: 01-Home or Self Care          Follow-up Information     Vernetta Lonni GRADE, MD Follow up in 2 week(s).   Specialty: Orthopedic Surgery Contact information: [address] [place]  [place] [address] [phone]                  Signed: Lonni GRADE Vernetta 03/14/2023, 10:56 AM

## 2023-03-14 NOTE — Progress Notes (Signed)
 Subjective: 1 Day Post-Op Procedure(s) (LRB): LEFT TOTAL KNEE ARTHROPLASTY (Left) Patient reports pain as moderate.    Objective: Vital signs in last 24 hours: Temp:  [97.5 F (36.4 C)-98.4 F (36.9 C)] 97.8 F (36.6 C) (02/08 1014) Pulse Rate:  [58-76] 63 (02/08 1014) Resp:  [10-20] 20 (02/08 1014) BP: (100-132)/(60-85) 118/68 (02/08 1014) SpO2:  [94 %-100 %] 99 % (02/08 1014)  Intake/Output from previous day: 02/07 0701 - 02/08 0700 In: 3236.3 [P.O.:1180; I.V.:1756.3; IV Piggyback:300] Out: 2345 [Urine:2195; Blood:150] Intake/Output this shift: Total I/O In: 240 [P.O.:240] Out: -   Recent Labs    03/14/23 0313  HGB 11.8*   Recent Labs    03/14/23 0313  WBC 13.1*  RBC 3.92*  HCT 35.2*  PLT 246   Recent Labs    03/14/23 0313  NA 133*  K 4.4  CL 101  CO2 24  BUN 26*  CREATININE 0.84  GLUCOSE 176*  CALCIUM 8.4*   No results for input(s): LABPT, INR in the last 72 hours.  Sensation intact distally Intact pulses distally Dorsiflexion/Plantar flexion intact Incision: dressing C/D/I Compartment soft   Assessment/Plan: 1 Day Post-Op Procedure(s) (LRB): LEFT TOTAL KNEE ARTHROPLASTY (Left) Up with therapy Discharge home with home health      Gregory Howard 03/14/2023, 10:41 AM

## 2023-03-14 NOTE — TOC Transition Note (Signed)
 Transition of Care [facility]) - Discharge Note   Patient Details  Name: Gregory Howard MRN: [id number] Date of Birth: 10/12/1960  [facility] CM/SW Contact:  Bascom Service, RN Phone Number: 03/14/2023, 1:29 PM   Clinical Narrative: [facility] already following pre arranged HHPT;Patient already has rw,3n1 even though ordered. Has own transport home. No further CM needs.      Final next level of care: Home w Home Health Services Barriers to Discharge: No Barriers Identified   Patient Goals and CMS Choice Patient states their goals for this hospitalization and ongoing recovery are:: Home CMS Medicare.gov Compare Post Acute Care list provided to:: Patient Choice offered to / list presented to : Patient      Discharge Placement                       Discharge Plan and Services Additional resources added to the After Visit Summary for                                       Social Drivers of Health (SDOH) Interventions SDOH Screenings   Food Insecurity: No Food Insecurity (03/13/2023)  Housing: Low Risk  (03/13/2023)  Transportation Needs: No Transportation Needs (03/13/2023)  Utilities: Not At Risk (03/13/2023)  Tobacco Use: Medium Risk (03/13/2023)     Readmission Risk Interventions     No data to display

## 2023-03-16 ENCOUNTER — Encounter (HOSPITAL_COMMUNITY): Payer: Self-pay | Admitting: Orthopaedic Surgery

## 2023-03-19 ENCOUNTER — Encounter: Payer: BC Managed Care – PPO | Admitting: Orthopaedic Surgery

## 2023-03-19 ENCOUNTER — Telehealth: Payer: Self-pay | Admitting: Orthopaedic Surgery

## 2023-03-19 NOTE — Telephone Encounter (Signed)
Called patient

## 2023-03-19 NOTE — Telephone Encounter (Signed)
Pt called requesting a call back. He states pain medication is making him constipated and want s to know if he can switch to another pain med or what. Please call pt about this matter at [phone].

## 2023-03-26 ENCOUNTER — Other Ambulatory Visit: Payer: Self-pay

## 2023-03-26 ENCOUNTER — Encounter: Payer: Self-pay | Admitting: Orthopaedic Surgery

## 2023-03-26 ENCOUNTER — Ambulatory Visit (INDEPENDENT_AMBULATORY_CARE_PROVIDER_SITE_OTHER): Payer: BC Managed Care – PPO | Admitting: Orthopaedic Surgery

## 2023-03-26 DIAGNOSIS — Z96652 Presence of left artificial knee joint: Secondary | ICD-10-CM

## 2023-03-26 NOTE — Progress Notes (Signed)
The patient is here today for his first postoperative visit status post a left total knee replacement to treat significant left knee arthritis.  We have also replaced his left hip in the past.  He has had home therapy.  He is really not taking any oxycodone except for Aleve twice a day.  On my exam his staples have been removed and Steri-Strips applied.  His calf is soft and there is no significant foot or ankle swelling.  There is swelling with his left knee to be expected.  His extension is almost full but I could only flex him to maybe 65 degrees.  We need to work on getting him set up for outpatient physical therapy.  He should take at least 2 Aleve twice a day with a full stomach and have encouraged him to take the pain medicine when he is pushing through physical therapy.  Will work on getting this set up for upstairs here and then we will see him back in a month for repeat exam but no x-rays are needed.

## 2023-04-01 ENCOUNTER — Encounter: Payer: Self-pay | Admitting: Rehabilitative and Restorative Service Providers"

## 2023-04-01 ENCOUNTER — Ambulatory Visit: Payer: BC Managed Care – PPO | Admitting: Rehabilitative and Restorative Service Providers"

## 2023-04-01 DIAGNOSIS — M6281 Muscle weakness (generalized): Secondary | ICD-10-CM

## 2023-04-01 DIAGNOSIS — M25562 Pain in left knee: Secondary | ICD-10-CM

## 2023-04-01 DIAGNOSIS — R2689 Other abnormalities of gait and mobility: Secondary | ICD-10-CM

## 2023-04-01 DIAGNOSIS — R600 Localized edema: Secondary | ICD-10-CM

## 2023-04-01 DIAGNOSIS — M25662 Stiffness of left knee, not elsewhere classified: Secondary | ICD-10-CM | POA: Diagnosis not present

## 2023-04-01 DIAGNOSIS — G8929 Other chronic pain: Secondary | ICD-10-CM

## 2023-04-01 NOTE — Therapy (Cosign Needed)
 OUTPATIENT PHYSICAL THERAPY LOWER EXTREMITY EVALUATION   Patient Name: Gregory Howard MRN: [id number] DOB:Jun 24, 1960, 63 y.o., male Today's Date: 04/01/2023  END OF SESSION:  PT End of Session - 04/01/23 1501     Visit Number 1    Number of Visits 20    Authorization Type [employer] 20% coinsurance    Progress Note Due on Visit 10    PT Start Time 1508    PT Stop Time 1559    PT Time Calculation (min) 51 min    Activity Tolerance Patient tolerated treatment well;Patient limited by pain    Behavior During Therapy [facility] for tasks assessed/performed             Past Medical History:  Diagnosis Date   Arthritis    History of kidney stones    Hypertension    Kidney stones    Pre-diabetes    Past Surgical History:  Procedure Laterality Date   EXTRACORPOREAL SHOCK WAVE LITHOTRIPSY Right 03/23/2017   Procedure: RIGHT EXTRACORPOREAL SHOCK WAVE LITHOTRIPSY (ESWL);  Surgeon: Marcine Matar, MD;  Location: [facility] ORS;  Service: Urology;  Laterality: Right;   HERNIA REPAIR     done as child   LITHOTRIPSY     TOTAL HIP ARTHROPLASTY Left 08/24/2020   Procedure: LEFT TOTAL HIP ARTHROPLASTY ANTERIOR APPROACH;  Surgeon: [name], MD;  Location: [facility];  Service: Orthopedics;  Laterality: Left;   TOTAL KNEE ARTHROPLASTY Left 03/13/2023   Procedure: LEFT TOTAL KNEE ARTHROPLASTY;  Surgeon: [name], MD;  Location: [facility];  Service: Orthopedics;  Laterality: Left;   Patient Active Problem List   Diagnosis Date Noted   Status post total left knee replacement 03/13/2023   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 States that he is more stiff. Did take an oxy before therapy session per doctor's advice. Usually just takes aleve 2x/day  Has had HHPT discharged 03/25/2023  PERTINENT HISTORY: L THA (2022), HTN, arthritis   PAIN:  NPRS scale: 0/10 currently, 7-8/10 at worst Pain location: L knee Pain description: sharp  Aggravating factors: bending the knee Relieving factors: icing, meds, rest  PRECAUTIONS: None  WEIGHT BEARING RESTRICTIONS: No  FALLS:  Has patient fallen in last 6 months? No  LIVING ENVIRONMENT: Lives with: lives with their family and lives with their spouse Lives in: House/apartment Stairs: Yes: External: 3 steps; none Has following equipment at home: Single point cane, Walker - 2 wheeled, Marine scientist  OCCUPATION: short term disability; does Biomedical engineer. Does need to be able to squat/bend down/ stand for long periods of time and will be wearing heavy steel toe boots. Needs to go back to work 12 wks from now (around 1st or 2nd week of May)  PLOF: Independent  PATIENT GOALS: "get back to normal and get back to work"   Next MD visit: 04/27/2023 Dr. Magnus Ivan   OBJECTIVE:   DIAGNOSTIC FINDINGS:  FINDINGS: Interval total left knee arthroplasty. No perihardware lucency is seen to indicate hardware failure or loosening. Expected postoperative changes including intra-articular and subcutaneous air. Smalljoint effusion. Anterior surgical skin staples. Mild chronic enthesopathic change at the quadriceps insertion on the patella. No acute fracture or dislocation.  PATIENT SURVEYS:   Patient-specific activity scoring scheme   "0" represents "unable to perform." "10" represents "able to perform at prior level. 0 1 2 3 4 5 6 7 8 9  10 (Date and  Score) Activity Initial  Activity Eval 04/01/2023    Bending/straightening knee for don/doff shoes 0    2. Stairs in reciprocal pattern 0    3. Standing for long periods of time 3   4.    5.     Total score = sum of the activity scores/number of activities Minimum detectable change (90%CI) for average score = 2 points Minimum detectable change (90%CI) for single activity score = 3 points  Score: 1/10   COGNITION: Overall cognitive status: WFL    SENSATION: WFL  EDEMA:  Circumferential: above knee: 45cm; below knee: 37.5cm  MUSCLE LENGTH: Not tested  POSTURE:  No Significant postural limitations  PALPATION: Non-tender to calf/quad/hamstrings. Notable HS tightness.  LOWER EXTREMITY ROM:   ROM Right eval Left eval  Hip flexion    Hip extension    Hip abduction    Hip adduction    Hip internal rotation    Hip external rotation    Knee flexion  A:72 P: not attempted due to patient pain and tightness  Knee extension  A: 4 P: 0  Ankle dorsiflexion    Ankle plantarflexion    Ankle inversion    Ankle eversion     (Blank rows = not tested)  LOWER EXTREMITY MMT:  MMT Right eval Left eval  Hip flexion 5 5  Hip extension    Hip abduction    Hip adduction    Hip internal rotation    Hip external rotation    Knee flexion 5 3-  Knee extension 5 3-  Ankle dorsiflexion 5 5  Ankle plantarflexion    Ankle inversion    Ankle eversion     (Blank rows = not tested)  LOWER EXTREMITY SPECIAL TESTS:  None tested  FUNCTIONAL TESTS:  Not tested   GAIT: Distance walked: clinic distance Assistive device utilized: Single point cane Level of assistance: Modified independence Comments: decreased knee extension, decreased stance time on LLE, decreased step length, foot flat due to decreased ability to extend knee- patient able to DF appropriately as seen in MMT  TODAY'S TREATMENT                                                                          DATE: Therex:    HEP instruction/performance c cues for techniques, handout provided.  Trial set performed of each for comprehension and symptom assessment.  See below for exercise list. Increased time taken for question and answer.   Vaso: L knee, , 34deg, mod pressure, BLE elevated   PATIENT EDUCATION:  Education details: HEP, POC Person educated: Patient Education method: Explanation, Demonstration, Verbal cues, and Handouts Education comprehension: verbalized understanding, returned demonstration, and verbal cues required  HOME EXERCISE PROGRAM: Access Code: [email or web address] URL: [email or web address] Date: 04/01/2023 Prepared by: Chyrel Masson   Exercises - Supine Heel Slide with Strap  - 3-5 x daily - 7 x weekly - 1 sets - 10 reps - 5-10 hold - Seated Knee Flexion AAROM (Mirrored)  - 3-5 x daily - 7 x weekly - 1 sets - 5 reps - 10-15 hold - Quad Setting and Stretching  - 3-5 x daily - 7 x weekly - 1 sets - 10 reps - 5 hold - Seated Quad Set (Mirrored)  - 3-5 x daily - 7 x weekly - 1 sets - 10 reps - 5 hold - Seated Long Arc Quad (Mirrored)  - 3-5 x daily - 7 x weekly - 1 sets - 5-10 reps - 2 hold - Long Sitting Calf Stretch with Strap  - 2-3 x daily - 7 x weekly - 1 sets - 3-5 reps - 15-30 hold - Seated Gastroc Stretch with Strap (Mirrored)  - 2-3 x daily - 7 x weekly - 1 sets - 5 reps - 30 hold  ASSESSMENT:  CLINICAL IMPRESSION: Patient is a 63 y.o. who comes to clinic with complaints of L knee pain with mobility, strength and movement coordination deficits that impair their ability to perform usual daily and recreational functional activities without increase difficulty/symptoms at this time.  Patient to benefit from skilled PT services to address impairments and limitations to improve to previous level of function  without restriction secondary to condition.   OBJECTIVE IMPAIRMENTS: Abnormal gait, decreased activity tolerance, decreased balance, decreased coordination, decreased endurance, decreased knowledge of use of DME, decreased mobility, difficulty walking, decreased ROM, decreased strength, hypomobility, increased edema, impaired perceived functional ability, impaired flexibility, and pain.   ACTIVITY LIMITATIONS: carrying, lifting, bending, sitting, standing, squatting, stairs, and locomotion level  PARTICIPATION LIMITATIONS: meal prep, cleaning, laundry, personal finances, driving, shopping, community activity, occupation, and yard work  PERSONAL FACTORS: Time since onset of injury/illness/exacerbation and 1-2 comorbidities: see above  are also affecting patient's functional outcome.   REHAB POTENTIAL: Good  CLINICAL DECISION MAKING: Stable/uncomplicated  EVALUATION COMPLEXITY: Low   GOALS: Goals reviewed with patient? Yes  SHORT TERM GOALS: (target date for Short term goals are 3 weeks 04/22/2023)   1.  Patient will demonstrate independent use of home exercise program to maintain progress from in clinic treatments.  Goal status: New  LONG TERM GOALS: (target dates for all long term goals are 10 weeks  06/10/2023 )   1. Patient will demonstrate/report pain at worst less than or equal to 2/10 to facilitate minimal limitation in daily activity  secondary to pain symptoms.  Goal status: New   2. Patient will demonstrate independent use of home exercise program to facilitate ability to maintain/progress functional gains from skilled physical therapy services.  Goal status: New   3. Patient will demonstrate Patient specific functional scale avg > or = 8 to indicate reduced disability due to condition.   Goal status: New   4.  Patient will demonstrate L LE MMT 5/5 throughout to faciltiate usual transfers, stairs, squatting at [facility] for daily life.   Goal status: New   5.  Patient will  demonstrate up and down a flight of stairs with single hand rail with reciprocal gait pattern Goal status: New   6.  Patient will verbalize tolerating long duration standing and work specific knee bending with </=2/10 pain.   Goal status: New   7.  Patient will demonstrate AROM 0-115deg  Goal Status: New   PLAN:  PT FREQUENCY: 3x/week  PT DURATION: 10 weeks  PLANNED INTERVENTIONS: Can include 16109- PT Re-evaluation, 97110-Therapeutic exercises, 97530- Therapeutic activity, 97112- Neuromuscular re-education, 97535- Self Care, 97140- Manual therapy, [phone]- Gait training, [phone]- Orthotic Fit/training, [phone]- Canalith repositioning, [id number]- Aquatic Therapy, [phone]- Electrical stimulation (unattended), [phone]- Electrical stimulation (manual), [id number] Physical performance testing, 97016- Vasopneumatic device, [id number]- Ultrasound, [id number]- Traction (mechanical), [id number]- Ionotophoresis 4mg /ml Dexamethasone, Patient/Family education, Balance training, Stair training, Taping, Dry Needling, Joint mobilization, Joint manipulation, Spinal manipulation, Spinal mobilization, Scar mobilization, Vestibular training, Visual/preceptual remediation/compensation, DME instructions, Cryotherapy, and Moist heat.  All performed as medically necessary.  All included unless contraindicated  PLAN FOR NEXT SESSION: Review HEP knowledge/results.    Jadiel Schmieder, Finlee Milo, Student-PT 04/01/2023, 5:42 PM

## 2023-04-02 ENCOUNTER — Encounter: Payer: Self-pay | Admitting: Physical Therapy

## 2023-04-02 ENCOUNTER — Ambulatory Visit (INDEPENDENT_AMBULATORY_CARE_PROVIDER_SITE_OTHER): Payer: BC Managed Care – PPO | Admitting: Physical Therapy

## 2023-04-02 DIAGNOSIS — M6281 Muscle weakness (generalized): Secondary | ICD-10-CM | POA: Diagnosis not present

## 2023-04-02 DIAGNOSIS — M25662 Stiffness of left knee, not elsewhere classified: Secondary | ICD-10-CM

## 2023-04-02 DIAGNOSIS — M25562 Pain in left knee: Secondary | ICD-10-CM | POA: Diagnosis not present

## 2023-04-02 DIAGNOSIS — R2689 Other abnormalities of gait and mobility: Secondary | ICD-10-CM | POA: Diagnosis not present

## 2023-04-02 DIAGNOSIS — R600 Localized edema: Secondary | ICD-10-CM

## 2023-04-02 DIAGNOSIS — G8929 Other chronic pain: Secondary | ICD-10-CM

## 2023-04-02 NOTE — Therapy (Addendum)
 OUTPATIENT PHYSICAL THERAPY TREATMENT   Patient Name: Gregory Howard MRN: [id number] DOB:[birth year], 63 y.o., male Today's Date: 04/02/2023  END OF SESSION:  PT End of Session - 04/02/23 1426     Visit Number 2    Number of Visits 25    Date for PT Re-Evaluation 06/11/23    Authorization Type [employer] 20% coinsurance    Progress Note Due on Visit 10    PT Start Time 1430    PT Stop Time 1525    PT Time Calculation (min) 55 min    Activity Tolerance Patient tolerated treatment well;Patient limited by pain    Behavior During Therapy [facility] for tasks assessed/performed              Past Medical History:  Diagnosis Date   Arthritis    History of kidney stones    Hypertension    Kidney stones    Pre-diabetes    Past Surgical History:  Procedure Laterality Date   EXTRACORPOREAL SHOCK WAVE LITHOTRIPSY Right 03/23/2017   Procedure: RIGHT EXTRACORPOREAL SHOCK WAVE LITHOTRIPSY (ESWL);  Surgeon: Marcine Matar, MD;  Location: [facility];  Service: Urology;  Laterality: Right;   HERNIA REPAIR     done as child   LITHOTRIPSY     TOTAL HIP ARTHROPLASTY Left 08/24/2020   Procedure: LEFT TOTAL HIP ARTHROPLASTY ANTERIOR APPROACH;  Surgeon: [name], MD;  Location: [facility];  Service: Orthopedics;  Laterality: Left;   TOTAL KNEE ARTHROPLASTY Left 03/13/2023   Procedure: LEFT TOTAL KNEE ARTHROPLASTY;  Surgeon: [name], MD;  Location: [facility];  Service: Orthopedics;  Laterality: Left;   Patient Active Problem List   Diagnosis Date Noted   Status post total left knee replacement 03/13/2023   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  Has been compliant with HEP- endorses pain in quads during quad sets and attributes it to tightness.   PERTINENT HISTORY: Lt THA (2022), HTN, arthritis   PAIN:  NPRS scale: 5-6/10 at rest, 7-8/10 at worst Pain location: L knee Pain description: sharp  Aggravating factors: bending the knee Relieving factors: icing, meds, rest  PRECAUTIONS: None  WEIGHT BEARING RESTRICTIONS: No  FALLS:  Has patient fallen in last 6 months? No  LIVING ENVIRONMENT: Lives with: lives with their family and lives with their spouse Lives in: House/apartment Stairs: Yes: External: 3 steps; none Has following equipment at home: Single point cane, Walker - 2 wheeled, Marine scientist  OCCUPATION: short term disability; does Biomedical engineer. Does need to be able to squat/bend down/ stand for long periods of time and will be wearing heavy steel toe boots. Needs to go back to work 12 wks from now (around 1st or 2nd week of May)  PLOF: Independent  PATIENT GOALS: "get back to normal and get back to work"   Next MD visit: 04/27/2023 Dr. Magnus Ivan   OBJECTIVE:  DIAGNOSTIC FINDINGS:  FINDINGS: Interval total left knee arthroplasty. No perihardware lucency is seen to indicate hardware failure or loosening. Expected postoperative changes including intra-articular and subcutaneous air. Smalljoint effusion. Anterior surgical skin staples. Mild chronic enthesopathic change at the quadriceps insertion on the patella. No acute fracture or dislocation.  PATIENT SURVEYS:   Patient-specific activity scoring scheme   "0" represents "unable to perform." "10" represents "able to perform at prior level. 0 1 2 3 4 5 6 7  8 9 10  (Date and Score) Activity Initial  Activity Eval 04/01/2023    Bending/straightening knee for don/doff shoes 0    2. Stairs in reciprocal pattern 0    3. Standing for long periods of time 3   4.    5.     Total score = sum of the activity scores/number of activities Minimum detectable change (90%CI) for average score = 2 points Minimum detectable change (90%CI) for single activity score = 3 points  Score: 1/10   COGNITION: 04/01/2023 Overall cognitive status: WFL    SENSATION: 04/01/2023 [facility]  EDEMA:  04/01/2023 Circumferential: above knee: 45cm; below knee: 37.5cm  MUSCLE LENGTH: 04/01/2023 Not tested  POSTURE:  04/01/2023 No Significant postural limitations  PALPATION: 04/01/2023 Non-tender to calf/quad/hamstrings. Notable HS tightness.  LOWER EXTREMITY ROM:   ROM Left 04/01/2023 Left 04/02/2023  Hip flexion    Hip extension    Hip abduction    Hip adduction    Hip internal rotation    Hip external rotation    Knee flexion A:72 P: not attempted due to patient pain and tightness Supine with strap AAROM:78  Knee extension A: 4 P: 0 A:2   Ankle dorsiflexion    Ankle plantarflexion    Ankle inversion    Ankle eversion     (Blank rows = not tested)  LOWER EXTREMITY MMT:  MMT Right 04/01/2023 Left 04/01/2023  Hip flexion 5 5  Hip extension    Hip abduction    Hip adduction    Hip internal rotation    Hip external rotation    Knee flexion 5 3-  Knee extension 5 3-  Ankle dorsiflexion 5 5  Ankle plantarflexion    Ankle inversion    Ankle eversion     (Blank rows = not tested)  LOWER EXTREMITY SPECIAL TESTS:  04/01/2023 None tested  FUNCTIONAL TESTS:  04/01/2023 Not tested   GAIT: 04/01/2023 Distance walked: clinic distance Assistive device utilized: Single point cane Level of assistance: Modified independence Comments: decreased knee extension, decreased stance time on LLE, decreased step length, foot flat due to decreased ability to  extend knee- patient able to DF appropriately as seen in MMT  TODAY'S TREATMENT                                                                          DATE:04/02/2023 Therex: Nustep level 3 seat 12 UE/LE use Heel slides x10 with strap then x10 with addition of ball for [employer] set x10 with 5sec hold  SAQ with ball under knee x15 with 3sec hold Mini squats at sink with chair behind for visual cue x15  Manual: Non-instrument and instrument assisted quad and medial/lateral knee massage to to address tightness Flexion with IR and distraction with overpressure to patient tolerance Flexion with IR and distraction contract relax   Vaso: to L knee, mod pressure, 32deg, BLE elevated with L ankle prop for facilitation of full extension   TREATMENT                                                                          DATE:04/01/2023 Therex:    HEP instruction/performance c cues for techniques, handout provided.  Trial set performed of each for comprehension and symptom assessment.  See below for exercise list. Increased time taken for question and answer.   Vaso: Lt knee, , 34deg, mod pressure, BLE elevated   PATIENT EDUCATION:  04/01/2023 Education details: HEP, POC Person educated: Patient Education method: Explanation, Demonstration, Verbal cues, and Handouts Education comprehension: verbalized understanding, returned demonstration, and verbal cues required  HOME EXERCISE PROGRAM: Access Code: [email or web address] URL: [email or web address] Date: 04/01/2023 Prepared by: Chyrel Masson   Exercises - Supine Heel Slide with Strap  - 3-5 x daily - 7 x weekly - 1 sets - 10 reps - 5-10 hold - Seated Knee Flexion AAROM (Mirrored)  - 3-5 x daily - 7 x weekly - 1 sets - 5 reps - 10-15 hold - Quad Setting  and Stretching  - 3-5 x daily - 7 x weekly - 1 sets - 10 reps - 5 hold - Seated Quad Set (Mirrored)  - 3-5 x daily - 7 x weekly - 1 sets - 10 reps - 5 hold - Seated Long Arc Quad (Mirrored)  - 3-5 x daily - 7 x weekly - 1 sets - 5-10 reps - 2 hold - Long Sitting Calf Stretch with Strap  - 2-3 x daily - 7 x weekly - 1 sets - 3-5 reps - 15-30 hold - Seated Gastroc Stretch with Strap (Mirrored)  - 2-3 x daily - 7 x weekly - 1 sets - 5 reps - 30 hold  ASSESSMENT:  CLINICAL IMPRESSION: Patient did well with activity today demonstrating increased flexion and extension ROM measurements. Continues to demonstrate stiffness and does best when manual is done before any activities involving flexion are attempted. Continues to make progress and will benefit from continued skilled physical therapy.    OBJECTIVE IMPAIRMENTS: Abnormal gait, decreased activity tolerance, decreased balance, decreased coordination, decreased endurance, decreased knowledge of use of DME, decreased mobility, difficulty walking, decreased ROM, decreased strength, hypomobility, increased  edema, impaired perceived functional ability, impaired flexibility, and pain.   ACTIVITY LIMITATIONS: carrying, lifting, bending, sitting, standing, squatting, stairs, and locomotion level  PARTICIPATION LIMITATIONS: meal prep, cleaning, laundry, personal finances, driving, shopping, community activity, occupation, and yard work  PERSONAL FACTORS: Time since onset of injury/illness/exacerbation and 1-2 comorbidities: see above  are also affecting patient's functional outcome.   REHAB POTENTIAL: Good  CLINICAL DECISION MAKING: Stable/uncomplicated  EVALUATION COMPLEXITY: Low   GOALS: Goals reviewed with patient? Yes  SHORT TERM GOALS: (target date for Short term goals are 3 weeks 04/22/2023)   1.  Patient will demonstrate independent use of home exercise program to maintain progress from in clinic treatments.  Goal status: New  LONG TERM  GOALS: (target dates for all long term goals are 10 weeks  06/10/2023 )   1. Patient will demonstrate/report pain at worst less than or equal to 2/10 to facilitate minimal limitation in daily activity secondary to pain symptoms.  Goal status: New   2. Patient will demonstrate independent use of home exercise program to facilitate ability to maintain/progress functional gains from skilled physical therapy services.  Goal status: New   3. Patient will demonstrate Patient specific functional scale avg > or = 8 to indicate reduced disability due to condition.   Goal status: New   4.  Patient will demonstrate Lt LE MMT 5/5 throughout to faciltiate usual transfers, stairs, squatting at [facility] for daily life.   Goal status: New   5.  Patient will demonstrate up and down a flight of stairs with single hand rail with reciprocal gait pattern Goal status: New   6.  Patient will demonstrate independent ambulation > 500 ft s deviation due to condition.   Goal status: New   7.  Patient will demonstrate AROM 0-115deg to facilitate usual mobility in daily activity at PLOF.  Goal Status: New   PLAN:  PT FREQUENCY: 2-3x/week (starting at 3x)  PT DURATION: 10 weeks  PLANNED INTERVENTIONS: Can include 46962- PT Re-evaluation, 97110-Therapeutic exercises, 97530- Therapeutic activity, 97112- Neuromuscular re-education, 97535- Self Care, 97140- Manual therapy, [phone]- Gait training, [phone]- Orthotic Fit/training, [phone]- Canalith repositioning, [id number]- Aquatic Therapy, [phone]- Electrical stimulation (unattended), [phone]- Electrical stimulation (manual), [id number] Physical performance testing, 97016- Vasopneumatic device, [id number]- Ultrasound, [id number]- Traction (mechanical), [id number]- Ionotophoresis 4mg /ml Dexamethasone, Patient/Family education, Balance training, Stair training, Taping, Dry Needling, Joint mobilization, Joint manipulation, Spinal manipulation, Spinal mobilization, Scar mobilization, Vestibular training,  Visual/preceptual remediation/compensation, DME instructions, Cryotherapy, and Moist heat.  All performed as medically necessary.  All included unless contraindicated  PLAN FOR NEXT SESSION: continue flexion ROM activities, quad stretching (prone with strap) while also targeting knee ROM. Overall LE strengthening.     Atlee Abide, Student-PT 04/01/2023, 5:42 PM   This entire session of physical therapy was performed under the direct supervision of PT signing evaluation /treatment. PT reviewed note and agrees.   Vladimir Faster, PT, DPT 04/02/2023, 4:31 PM

## 2023-04-06 ENCOUNTER — Encounter: Payer: Self-pay | Admitting: Physical Therapy

## 2023-04-06 ENCOUNTER — Ambulatory Visit (INDEPENDENT_AMBULATORY_CARE_PROVIDER_SITE_OTHER): Payer: BC Managed Care – PPO | Admitting: Physical Therapy

## 2023-04-06 DIAGNOSIS — R2689 Other abnormalities of gait and mobility: Secondary | ICD-10-CM

## 2023-04-06 DIAGNOSIS — R600 Localized edema: Secondary | ICD-10-CM

## 2023-04-06 DIAGNOSIS — M25562 Pain in left knee: Secondary | ICD-10-CM | POA: Diagnosis not present

## 2023-04-06 DIAGNOSIS — G8929 Other chronic pain: Secondary | ICD-10-CM

## 2023-04-06 DIAGNOSIS — M25662 Stiffness of left knee, not elsewhere classified: Secondary | ICD-10-CM | POA: Diagnosis not present

## 2023-04-06 DIAGNOSIS — M6281 Muscle weakness (generalized): Secondary | ICD-10-CM

## 2023-04-06 NOTE — Therapy (Signed)
 OUTPATIENT PHYSICAL THERAPY TREATMENT   Patient Name: Gregory Howard MRN: [id number] DOB:Oct 28, 1960, 63 y.o., male Today's Date: 04/06/2023  END OF SESSION:  PT End of Session - 04/06/23 1150     Visit Number 3    Number of Visits 25    Date for PT Re-Evaluation 06/11/23    Authorization Type [employer] 20% coinsurance    Progress Note Due on Visit 10    PT Start Time 1147    PT Stop Time 1232    PT Time Calculation (min) 45 min    Activity Tolerance Patient tolerated treatment well;Patient limited by pain    Behavior During Therapy [facility] for tasks assessed/performed               Past Medical History:  Diagnosis Date   Arthritis    History of kidney stones    Hypertension    Kidney stones    Pre-diabetes    Past Surgical History:  Procedure Laterality Date   EXTRACORPOREAL SHOCK WAVE LITHOTRIPSY Right 03/23/2017   Procedure: RIGHT EXTRACORPOREAL SHOCK WAVE LITHOTRIPSY (ESWL);  Surgeon: Marcine Matar, MD;  Location: [facility];  Service: Urology;  Laterality: Right;   HERNIA REPAIR     done as child   LITHOTRIPSY     TOTAL HIP ARTHROPLASTY Left 08/24/2020   Procedure: LEFT TOTAL HIP ARTHROPLASTY ANTERIOR APPROACH;  Surgeon: [name], MD;  Location: [facility];  Service: Orthopedics;  Laterality: Left;   TOTAL KNEE ARTHROPLASTY Left 03/13/2023   Procedure: LEFT TOTAL KNEE ARTHROPLASTY;  Surgeon: [name], MD;  Location: [facility];  Service: Orthopedics;  Laterality: Left;   Patient Active Problem List   Diagnosis Date Noted   Status post total left knee replacement 03/13/2023   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 Pt reporting pain of 6/10 with bending. Pt reporting taking pain meds as needed. Pt stating he is having to take Tylenol PM to help sleep.   PERTINENT HISTORY: Lt THA (2022), HTN, arthritis   PAIN:  NPRS scale: 6/10 at present Pain location: L knee Pain description: sharp  Aggravating factors: bending the knee Relieving factors: icing, meds, rest  PRECAUTIONS: None  WEIGHT BEARING RESTRICTIONS: No  FALLS:  Has patient fallen in last 6 months? No  LIVING ENVIRONMENT: Lives with: lives with their family and lives with their spouse Lives in: House/apartment Stairs: Yes: External: 3 steps; none Has following equipment at home: Single point cane, Walker - 2 wheeled, Marine scientist  OCCUPATION: short term disability; does Biomedical engineer. Does need to be able to squat/bend down/ stand for long periods of time and will be wearing heavy steel toe boots. Needs to go back to work 12 wks from now (around 1st or 2nd week of May)  PLOF: Independent  PATIENT GOALS: "get back to normal and get back to work"   Next MD visit: 04/27/2023 Dr. Magnus Ivan   OBJECTIVE:   DIAGNOSTIC FINDINGS:  FINDINGS: Interval total left knee arthroplasty.  No perihardware lucency is seen to indicate hardware failure or loosening. Expected postoperative changes including intra-articular and subcutaneous air. Smalljoint effusion. Anterior surgical skin staples. Mild chronic enthesopathic change at the quadriceps insertion on the patella. No acute fracture or dislocation.  PATIENT SURVEYS:   Patient-specific activity scoring scheme   "0" represents "unable to perform." "10" represents "able to perform at prior level. 0 1 2 3 4 5 6 7 8 9  10 (Date and Score) Activity Initial  Activity Eval 04/01/2023     Bending/straightening knee for don/doff shoes 0    2. Stairs in reciprocal pattern 0    3. Standing for long periods of time 3   4.    5.     Total score = sum of the activity scores/number of activities Minimum detectable change (90%CI) for average score = 2 points Minimum detectable change (90%CI) for single activity score = 3 points  Score: 1/10   COGNITION: 04/01/2023 Overall cognitive status: WFL    SENSATION: 04/01/2023 [facility]  EDEMA:  04/01/2023 Circumferential: above knee: 45cm; below knee: 37.5cm  MUSCLE LENGTH: 04/01/2023 Not tested  POSTURE:  04/01/2023 No Significant postural limitations  PALPATION: 04/01/2023 Non-tender to calf/quad/hamstrings. Notable HS tightness.  LOWER EXTREMITY ROM:   ROM Left 04/01/2023 Left 04/02/2023 Left 04/06/23  Hip flexion     Hip extension     Hip abduction     Hip adduction     Hip internal rotation     Hip external rotation     Knee flexion A:72 P: not attempted due to patient pain and tightness Supine with strap AAROM:78 Supine A: 85 P: 90  Knee extension A: 4 P: 0 A:2  A: 2 P: 0  Ankle dorsiflexion     Ankle plantarflexion     Ankle inversion     Ankle eversion      (Blank rows = not tested)  LOWER EXTREMITY MMT:  MMT Right 04/01/2023 Left 04/01/2023  Hip flexion 5 5  Hip extension    Hip abduction    Hip adduction    Hip internal rotation    Hip external rotation    Knee flexion 5 3-  Knee extension 5 3-  Ankle dorsiflexion 5 5  Ankle plantarflexion    Ankle inversion    Ankle eversion     (Blank rows = not tested)  LOWER EXTREMITY SPECIAL TESTS:  04/01/2023 None tested  FUNCTIONAL TESTS:  04/01/2023 Not tested   GAIT: 04/01/2023 Distance walked: clinic distance Assistive device utilized: Single point cane Level of assistance: Modified independence Comments: decreased knee extension, decreased stance time on LLE, decreased step length, foot flat due to decreased ability to extend knee-  patient able to DF appropriately as seen in MMT  TODAY'S TREATMENT                                                                          DATE:  04/06/23   Scifit bike: seat 14, partial revolutions to full revolutions, L3 x 8 minutes seated heel slides:  2 x 10 holding 3-5 sec at end range seated SLR: 2 x 10 Left LE Supine heel slides c stretch strap holding end range x 5 sec x 10 Calf stretch on slant board: x 3 holding 20 sec TherEx:  Sit to stand: 2 x 10  Leg Press:  bil LE's 75# 3 x 10, Left LE only x 10, 37# Step ups on 6 inch using hand held support and St cane x 15     TODAY'S TREATMENT                                                                          DATE:04/02/2023 Therex: Nustep level 3 seat 12 UE/LE use Heel slides x10 with strap then x10 with addition of ball for [employer] set x10 with 5sec hold  SAQ with ball under knee x15 with 3sec hold Mini squats at sink with chair behind for visual cue x15  Manual: Non-instrument and instrument assisted quad and medial/lateral knee massage to to address tightness Flexion with IR and distraction with overpressure to patient tolerance Flexion with IR and distraction contract relax   Vaso: to L knee, mod pressure, 32deg, BLE elevated with L ankle prop for facilitation of full extension   TREATMENT                                                                          DATE:04/01/2023 Therex:    HEP instruction/performance c cues for techniques, handout provided.  Trial set performed of each for comprehension and symptom assessment.  See below for exercise list. Increased time taken for question and answer.   Vaso: Lt knee, , 34deg, mod pressure, BLE elevated   PATIENT EDUCATION:  04/01/2023 Education details: HEP, POC Person educated:  Patient Education method: Explanation, Demonstration, Verbal cues, and Handouts Education comprehension: verbalized understanding, returned demonstration, and verbal cues required  HOME EXERCISE PROGRAM: Access Code: [email or web address] URL: [email or web address] Date: 04/01/2023 Prepared by: Chyrel Masson   Exercises - Supine Heel Slide with Strap  - 3-5 x daily - 7 x weekly - 1 sets - 10 reps - 5-10 hold - Seated Knee Flexion AAROM (Mirrored)  - 3-5 x daily - 7 x weekly - 1 sets - 5 reps - 10-15 hold - Quad Setting and Stretching  - 3-5 x daily - 7 x weekly - 1 sets - 10 reps -  5 hold - Seated Quad Set (Mirrored)  - 3-5 x daily - 7 x weekly - 1 sets - 10 reps - 5 hold - Seated Long Arc Quad (Mirrored)  - 3-5 x daily - 7 x weekly - 1 sets - 5-10 reps - 2 hold - Long Sitting Calf Stretch with Strap  - 2-3 x daily - 7 x weekly - 1 sets - 3-5 reps - 15-30 hold - Seated Gastroc Stretch with Strap (Mirrored)  - 2-3 x daily - 7 x weekly - 1 sets - 5 reps - 30 hold  ASSESSMENT:  CLINICAL IMPRESSION: Pt tolerating exercises well focusing on ROM and strengthening. Pt amb into clinic today using straight cane. Pt still reporting moderate pain most days with worse pain up to 8-9/10. Pt's Passive left knee flexion was 90 degrees. Continue to progress with skilled PT interventions.   OBJECTIVE IMPAIRMENTS: Abnormal gait, decreased activity tolerance, decreased balance, decreased coordination, decreased endurance, decreased knowledge of use of DME, decreased mobility, difficulty walking, decreased ROM, decreased strength, hypomobility, increased edema, impaired perceived functional ability, impaired flexibility, and pain.   ACTIVITY LIMITATIONS: carrying, lifting, bending, sitting, standing, squatting, stairs, and locomotion level  PARTICIPATION LIMITATIONS: meal prep, cleaning, laundry, personal finances, driving, shopping, community activity, occupation, and yard work  PERSONAL FACTORS: Time  since onset of injury/illness/exacerbation and 1-2 comorbidities: see above  are also affecting patient's functional outcome.   REHAB POTENTIAL: Good  CLINICAL DECISION MAKING: Stable/uncomplicated  EVALUATION COMPLEXITY: Low   GOALS: Goals reviewed with patient? Yes  SHORT TERM GOALS: (target date for Short term goals are 3 weeks 04/22/2023)   1.  Patient will demonstrate independent use of home exercise program to maintain progress from in clinic treatments.  Goal status: New  LONG TERM GOALS: (target dates for all long term goals are 10 weeks  06/10/2023 )   1. Patient will demonstrate/report pain at worst less than or equal to 2/10 to facilitate minimal limitation in daily activity secondary to pain symptoms.  Goal status: New   2. Patient will demonstrate independent use of home exercise program to facilitate ability to maintain/progress functional gains from skilled physical therapy services.  Goal status: New   3. Patient will demonstrate Patient specific functional scale avg > or = 8 to indicate reduced disability due to condition.   Goal status: New   4.  Patient will demonstrate Lt LE MMT 5/5 throughout to faciltiate usual transfers, stairs, squatting at [facility] for daily life.   Goal status: New   5.  Patient will demonstrate up and down a flight of stairs with single hand rail with reciprocal gait pattern Goal status: New   6.  Patient will demonstrate independent ambulation > 500 ft s deviation due to condition.   Goal status: New   7.  Patient will demonstrate AROM 0-115deg to facilitate usual mobility in daily activity at PLOF.  Goal Status: New   PLAN:  PT FREQUENCY: 2-3x/week (starting at 3x)  PT DURATION: 10 weeks  PLANNED INTERVENTIONS: Can include 28413- PT Re-evaluation, 97110-Therapeutic exercises, 97530- Therapeutic activity, 97112- Neuromuscular re-education, 97535- Self Care, 97140- Manual therapy, [phone]- Gait training, [phone]- Orthotic  Fit/training, [phone]- Canalith repositioning, [id number]- Aquatic Therapy, [phone]- Electrical stimulation (unattended), [phone]- Electrical stimulation (manual), [id number] Physical performance testing, 97016- Vasopneumatic device, [id number]- Ultrasound, [id number]- Traction (mechanical), [id number]- Ionotophoresis 4mg /ml Dexamethasone, Patient/Family education, Balance training, Stair training, Taping, Dry Needling, Joint mobilization, Joint manipulation, Spinal manipulation, Spinal mobilization, Scar mobilization, Vestibular training, Visual/preceptual remediation/compensation, DME instructions, Cryotherapy,  and Moist heat.  All performed as medically necessary.  All included unless contraindicated  PLAN FOR NEXT SESSION: continue flexion ROM activities, quad stretching (prone with strap) while also targeting knee ROM. Overall LE strengthening.   Scifit bike      Sharmon Leyden, [place], MPT 04/06/2023, 11:52 AM

## 2023-04-07 ENCOUNTER — Ambulatory Visit (INDEPENDENT_AMBULATORY_CARE_PROVIDER_SITE_OTHER): Payer: BC Managed Care – PPO | Admitting: Physical Therapy

## 2023-04-07 ENCOUNTER — Encounter: Payer: Self-pay | Admitting: Physical Therapy

## 2023-04-07 DIAGNOSIS — M6281 Muscle weakness (generalized): Secondary | ICD-10-CM | POA: Diagnosis not present

## 2023-04-07 DIAGNOSIS — R2689 Other abnormalities of gait and mobility: Secondary | ICD-10-CM | POA: Diagnosis not present

## 2023-04-07 DIAGNOSIS — G8929 Other chronic pain: Secondary | ICD-10-CM

## 2023-04-07 DIAGNOSIS — R600 Localized edema: Secondary | ICD-10-CM

## 2023-04-07 DIAGNOSIS — M25662 Stiffness of left knee, not elsewhere classified: Secondary | ICD-10-CM

## 2023-04-07 DIAGNOSIS — M25562 Pain in left knee: Secondary | ICD-10-CM | POA: Diagnosis not present

## 2023-04-07 NOTE — Therapy (Signed)
 OUTPATIENT PHYSICAL THERAPY TREATMENT   Patient Name: Gregory Howard MRN: [id number] DOB:09/29/1960, 63 y.o., male Today's Date: 04/07/2023  END OF SESSION:  PT End of Session - 04/07/23 1312     Visit Number 4    Number of Visits 25    Date for PT Re-Evaluation 06/11/23    Authorization Type [employer] 20% coinsurance    Progress Note Due on Visit 10    PT Start Time 1305    PT Stop Time 1345    PT Time Calculation (min) 40 min    Activity Tolerance Patient tolerated treatment well    Behavior During Therapy WFL for tasks assessed/performed               Past Medical History:  Diagnosis Date   Arthritis    History of kidney stones    Hypertension    Kidney stones    Pre-diabetes    Past Surgical History:  Procedure Laterality Date   EXTRACORPOREAL SHOCK WAVE LITHOTRIPSY Right 03/23/2017   Procedure: RIGHT EXTRACORPOREAL SHOCK WAVE LITHOTRIPSY (ESWL);  Surgeon: Marcine Matar, MD;  Location: [facility];  Service: Urology;  Laterality: Right;   HERNIA REPAIR     done as child   LITHOTRIPSY     TOTAL HIP ARTHROPLASTY Left 08/24/2020   Procedure: LEFT TOTAL HIP ARTHROPLASTY ANTERIOR APPROACH;  Surgeon: [name], MD;  Location: [facility];  Service: Orthopedics;  Laterality: Left;   TOTAL KNEE ARTHROPLASTY Left 03/13/2023   Procedure: LEFT TOTAL KNEE ARTHROPLASTY;  Surgeon: [name], MD;  Location: [facility];  Service: Orthopedics;  Laterality: Left;   Patient Active Problem List   Diagnosis Date Noted   Status post total left knee replacement 03/13/2023   Status post total replacement of left hip 08/24/2020    PCP: Daisy Floro, MD  REFERRING PROVIDER: [name]  REFERRING DIAG:  W09.811 Status post total left knee replacement - Primary    THERAPY DIAG:  Chronic pain of left knee  Stiffness of left knee, not elsewhere classified  Muscle weakness (generalized)  Other abnormalities of gait and mobility  Localized  edema  Rationale for Evaluation and Treatment: Rehabilitation  ONSET DATE: 03/13/2023 L TKA  SUBJECTIVE:   SUBJECTIVE STATEMENT: Relays was not able to take pain meds before PT as he did not have driver so he needed to drive himself  PERTINENT HISTORY: Lt THA (2022), HTN, arthritis   PAIN:  NPRS scale: 6/10 at present Pain location: L knee Pain description: sharp  Aggravating factors: bending the knee Relieving factors: icing, meds, rest  PRECAUTIONS: None  WEIGHT BEARING RESTRICTIONS: No  FALLS:  Has patient fallen in last 6 months? No  LIVING ENVIRONMENT: Lives with: lives with their family and lives with their spouse Lives in: House/apartment Stairs: Yes: External: 3 steps; none Has following equipment at home: Single point cane, Walker - 2 wheeled, Marine scientist  OCCUPATION: short term disability; does Biomedical engineer. Does need to be able to squat/bend down/ stand for long periods of time and will be wearing heavy steel toe boots. Needs to go back to work 12 wks from now (around 1st or 2nd week of May)  PLOF: Independent  PATIENT GOALS: "get back to normal and get back to work"   Next MD visit: 04/27/2023 Dr. Magnus Ivan   OBJECTIVE:   DIAGNOSTIC FINDINGS:  FINDINGS: Interval total left knee arthroplasty. No perihardware lucency is seen to indicate hardware failure or loosening. Expected postoperative changes including intra-articular  and subcutaneous air. Smalljoint effusion. Anterior surgical skin staples. Mild chronic enthesopathic change at the quadriceps insertion on the patella. No acute fracture or dislocation.  PATIENT SURVEYS:   Patient-specific activity scoring scheme   "0" represents "unable to perform." "10" represents "able to perform at prior level. 0 1 2 3 4 5 6 7 8 9  10 (Date and Score) Activity Initial  Activity Eval 04/01/2023    Bending/straightening knee for don/doff shoes 0    2. Stairs in reciprocal pattern 0    3. Standing for  long periods of time 3   4.    5.     Total score = sum of the activity scores/number of activities Minimum detectable change (90%CI) for average score = 2 points Minimum detectable change (90%CI) for single activity score = 3 points  Score: 1/10   COGNITION: 04/01/2023 Overall cognitive status: WFL    SENSATION: 04/01/2023 [employer]  EDEMA:  04/01/2023 Circumferential: above knee: 45cm; below knee: 37.5cm  MUSCLE LENGTH: 04/01/2023 Not tested  POSTURE:  04/01/2023 No Significant postural limitations  PALPATION: 04/01/2023 Non-tender to calf/quad/hamstrings. Notable HS tightness.  LOWER EXTREMITY ROM:   ROM Left 04/01/2023 Left 04/02/2023 Left 04/06/23  Hip flexion     Hip extension     Hip abduction     Hip adduction     Hip internal rotation     Hip external rotation     Knee flexion A:72 P: not attempted due to patient pain and tightness Supine with strap AAROM:78 Supine A: 85 P: 90  Knee extension A: 4 P: 0 A:2  A: 2 P: 0  Ankle dorsiflexion     Ankle plantarflexion     Ankle inversion     Ankle eversion      (Blank rows = not tested)  LOWER EXTREMITY MMT:  MMT Right 04/01/2023 Left 04/01/2023  Hip flexion 5 5  Hip extension    Hip abduction    Hip adduction    Hip internal rotation    Hip external rotation    Knee flexion 5 3-  Knee extension 5 3-  Ankle dorsiflexion 5 5  Ankle plantarflexion    Ankle inversion    Ankle eversion     (Blank rows = not tested)  LOWER EXTREMITY SPECIAL TESTS:  04/01/2023 None tested  FUNCTIONAL TESTS:  04/01/2023 Not tested   GAIT: 04/01/2023 Distance walked: clinic distance Assistive device utilized: Single point cane Level of assistance: Modified independence Comments: decreased knee extension, decreased stance time on LLE, decreased step length, foot flat due to decreased ability to extend knee- patient able to DF appropriately as seen in MMT  TODAY'S TREATMENT                                                                          DATE:  04/07/23   Scifit bike: seat 14, full revolutions, L3 x 8 minutes Standing knee flexion lunge stretch with foot on treadmill 5 sec X 10 Seated knee flexion AAROM 5 sec X 10 Calf stretch on slant board: x 3 holding 30 sec TherEx:  Sit to stand: 2 x 10  Leg Press:  bil LE's 75# 3 x 10, Left LE only 37# 2X10 Step ups on 6 inch step with one UE support, leading with left X 10 Lateral step ups on 6 inch step one UE support leading with left X 10 Manual therapy Left knee PROM with overpressure into flexion and extension  TODAY'S TREATMENT                                                                          DATE:  04/06/23   Scifit bike: seat 14, partial revolutions to full revolutions, L3 x 8 minutes seated heel slides:  2 x 10 holding 3-5 sec at end range seated SLR: 2 x 10 Left LE Supine heel slides c stretch strap holding end range x 5 sec x 10 Calf stretch on slant board: x 3 holding 20 sec TherEx:  Sit to stand: 2 x 10  Leg Press:  bil LE's 75# 3 x 10, Left LE only x 10, 37# Step ups on 6 inch using hand held support and St cane x 15     TODAY'S TREATMENT                                                                          DATE:04/02/2023 Therex: Nustep level 3 seat 12 UE/LE use Heel slides x10 with strap then x10 with addition of ball for [employer] set x10 with 5sec hold  SAQ with ball under knee x15 with 3sec hold Mini squats at sink with chair behind for visual cue x15  Manual: Non-instrument and instrument assisted quad and medial/lateral knee massage to to address tightness Flexion with IR and distraction with overpressure to patient tolerance Flexion with IR and distraction contract relax   Vaso: to L knee, mod pressure, 32deg, BLE elevated with L ankle prop  for facilitation of full extension   TREATMENT  DATE:04/01/2023 Therex:    HEP instruction/performance c cues for techniques, handout provided.  Trial set performed of each for comprehension and symptom assessment.  See below for exercise list. Increased time taken for question and answer.   Vaso: Lt knee, , 34deg, mod pressure, BLE elevated   PATIENT EDUCATION:  04/01/2023 Education details: HEP, POC Person educated: Patient Education method: Explanation, Demonstration, Verbal cues, and Handouts Education comprehension: verbalized understanding, returned demonstration, and verbal cues required  HOME EXERCISE PROGRAM: Access Code: [email or web address] URL: [email or web address] Date: 04/01/2023 Prepared by: Chyrel Masson   Exercises - Supine Heel Slide with Strap  - 3-5 x daily - 7 x weekly - 1 sets - 10 reps - 5-10 hold - Seated Knee Flexion AAROM (Mirrored)  - 3-5 x daily - 7 x weekly - 1 sets - 5 reps - 10-15 hold - Quad Setting and Stretching  - 3-5 x daily - 7 x weekly - 1 sets - 10 reps - 5 hold - Seated Quad Set (Mirrored)  - 3-5 x daily - 7 x weekly - 1 sets - 10 reps - 5 hold - Seated Long Arc Quad (Mirrored)  - 3-5 x daily - 7 x weekly - 1 sets - 5-10 reps - 2 hold - Long Sitting Calf Stretch with Strap  - 2-3 x daily - 7 x weekly - 1 sets - 3-5 reps - 15-30 hold - Seated Gastroc Stretch with Strap (Mirrored)  - 2-3 x daily - 7 x weekly - 1 sets - 5 reps - 30 hold  ASSESSMENT:  CLINICAL IMPRESSION: He showed some improvements in knee flexion ROM by being able to do full revolutions on bike right from the start. Focused on knee flexion ROM as this is his biggest deficit. He tolerated stretching and strengthening program well today.   OBJECTIVE IMPAIRMENTS: Abnormal gait, decreased activity tolerance, decreased balance, decreased coordination, decreased endurance, decreased knowledge of use of  DME, decreased mobility, difficulty walking, decreased ROM, decreased strength, hypomobility, increased edema, impaired perceived functional ability, impaired flexibility, and pain.   ACTIVITY LIMITATIONS: carrying, lifting, bending, sitting, standing, squatting, stairs, and locomotion level  PARTICIPATION LIMITATIONS: meal prep, cleaning, laundry, personal finances, driving, shopping, community activity, occupation, and yard work  PERSONAL FACTORS: Time since onset of injury/illness/exacerbation and 1-2 comorbidities: see above  are also affecting patient's functional outcome.   REHAB POTENTIAL: Good  CLINICAL DECISION MAKING: Stable/uncomplicated  EVALUATION COMPLEXITY: Low   GOALS: Goals reviewed with patient? Yes  SHORT TERM GOALS: (target date for Short term goals are 3 weeks 04/22/2023)   1.  Patient will demonstrate independent use of home exercise program to maintain progress from in clinic treatments.  Goal status: New  LONG TERM GOALS: (target dates for all long term goals are 10 weeks  06/10/2023 )   1. Patient will demonstrate/report pain at worst less than or equal to 2/10 to facilitate minimal limitation in daily activity secondary to pain symptoms.  Goal status: New   2. Patient will demonstrate independent use of home exercise program to facilitate ability to maintain/progress functional gains from skilled physical therapy services.  Goal status: New   3. Patient will demonstrate Patient specific functional scale avg > or = 8 to indicate reduced disability due to condition.   Goal status: New   4.  Patient will demonstrate Lt LE MMT 5/5 throughout to faciltiate usual transfers, stairs, squatting at [facility] for daily life.   Goal status: New   5.  Patient will demonstrate up  and down a flight of stairs with single hand rail with reciprocal gait pattern Goal status: New   6.  Patient will demonstrate independent ambulation > 500 ft s deviation due to condition.    Goal status: New   7.  Patient will demonstrate AROM 0-115deg to facilitate usual mobility in daily activity at PLOF.  Goal Status: New   PLAN:  PT FREQUENCY: 2-3x/week (starting at 3x)  PT DURATION: 10 weeks  PLANNED INTERVENTIONS: Can include 29518- PT Re-evaluation, 97110-Therapeutic exercises, 97530- Therapeutic activity, 97112- Neuromuscular re-education, 97535- Self Care, 97140- Manual therapy, [phone]- Gait training, [phone]- Orthotic Fit/training, [phone]- Canalith repositioning, [id number]- Aquatic Therapy, [phone]- Electrical stimulation (unattended), [phone]- Electrical stimulation (manual), [id number] Physical performance testing, 97016- Vasopneumatic device, [id number]- Ultrasound, [id number]- Traction (mechanical), [id number]- Ionotophoresis 4mg /ml Dexamethasone, Patient/Family education, Balance training, Stair training, Taping, Dry Needling, Joint mobilization, Joint manipulation, Spinal manipulation, Spinal mobilization, Scar mobilization, Vestibular training, Visual/preceptual remediation/compensation, DME instructions, Cryotherapy, and Moist heat.  All performed as medically necessary.  All included unless contraindicated  PLAN FOR NEXT SESSION: continue flexion ROM activities, quad stretching (prone with strap) while also targeting knee ROM. Overall LE strengthening.   Scifit bike      April Manson, PT, MPT 04/07/2023, 1:12 PM

## 2023-04-08 ENCOUNTER — Ambulatory Visit (INDEPENDENT_AMBULATORY_CARE_PROVIDER_SITE_OTHER): Payer: BC Managed Care – PPO | Admitting: Physical Therapy

## 2023-04-08 ENCOUNTER — Encounter: Payer: Self-pay | Admitting: Physical Therapy

## 2023-04-08 DIAGNOSIS — M6281 Muscle weakness (generalized): Secondary | ICD-10-CM | POA: Diagnosis not present

## 2023-04-08 DIAGNOSIS — M25562 Pain in left knee: Secondary | ICD-10-CM

## 2023-04-08 DIAGNOSIS — R600 Localized edema: Secondary | ICD-10-CM

## 2023-04-08 DIAGNOSIS — G8929 Other chronic pain: Secondary | ICD-10-CM

## 2023-04-08 DIAGNOSIS — M25662 Stiffness of left knee, not elsewhere classified: Secondary | ICD-10-CM | POA: Diagnosis not present

## 2023-04-08 DIAGNOSIS — R2689 Other abnormalities of gait and mobility: Secondary | ICD-10-CM | POA: Diagnosis not present

## 2023-04-08 NOTE — Therapy (Addendum)
 OUTPATIENT PHYSICAL THERAPY TREATMENT   Patient Name: Gregory Howard MRN: [id number] DOB:1960-11-29, 63 y.o., male Today's Date: 04/08/2023  END OF SESSION:  PT End of Session - 04/08/23 1047     Visit Number 5    Number of Visits 25    Date for PT Re-Evaluation 06/11/23    Authorization Type [employer] 20% coinsurance    Progress Note Due on Visit 10    PT Start Time 1100    PT Stop Time 1148    PT Time Calculation (min) 48 min    Activity Tolerance Patient tolerated treatment well    Behavior During Therapy WFL for tasks assessed/performed               Past Medical History:  Diagnosis Date   Arthritis    History of kidney stones    Hypertension    Kidney stones    Pre-diabetes    Past Surgical History:  Procedure Laterality Date   EXTRACORPOREAL SHOCK WAVE LITHOTRIPSY Right 03/23/2017   Procedure: RIGHT EXTRACORPOREAL SHOCK WAVE LITHOTRIPSY (ESWL);  Surgeon: Marcine Matar, MD;  Location: [facility];  Service: Urology;  Laterality: Right;   HERNIA REPAIR     done as child   LITHOTRIPSY     TOTAL HIP ARTHROPLASTY Left 08/24/2020   Procedure: LEFT TOTAL HIP ARTHROPLASTY ANTERIOR APPROACH;  Surgeon: [name], MD;  Location: [facility];  Service: Orthopedics;  Laterality: Left;   TOTAL KNEE ARTHROPLASTY Left 03/13/2023   Procedure: LEFT TOTAL KNEE ARTHROPLASTY;  Surgeon: [name], MD;  Location: [facility];  Service: Orthopedics;  Laterality: Left;   Patient Active Problem List   Diagnosis Date Noted   Status post total left knee replacement 03/13/2023   Status post total replacement of left hip 08/24/2020    PCP: Daisy Floro, MD  REFERRING PROVIDER: [name]  REFERRING DIAG:  H84.696 Status post total left knee replacement - Primary    THERAPY DIAG:  Chronic pain of left knee  Muscle weakness (generalized)  Stiffness of left knee, not elsewhere classified  Other abnormalities of gait and mobility  Localized  edema  Rationale for Evaluation and Treatment: Rehabilitation  ONSET DATE: 03/13/2023 L TKA  SUBJECTIVE:   SUBJECTIVE STATEMENT: Relays was not able to take pain meds before PT as he did not have driver so he needed to drive himself  PERTINENT HISTORY: Lt THA (2022), HTN, arthritis   PAIN:  NPRS scale: 6/10 at present Pain location: L knee Pain description: sharp  Aggravating factors: bending the knee Relieving factors: icing, meds, rest  PRECAUTIONS: None  WEIGHT BEARING RESTRICTIONS: No  FALLS:  Has patient fallen in last 6 months? No  LIVING ENVIRONMENT: Lives with: lives with their family and lives with their spouse Lives in: House/apartment Stairs: Yes: External: 3 steps; none Has following equipment at home: Single point cane, Walker - 2 wheeled, Marine scientist  OCCUPATION: short term disability; does Biomedical engineer. Does need to be able to squat/bend down/ stand for long periods of time and will be wearing heavy steel toe boots. Needs to go back to work 12 wks from now (around 1st or 2nd week of May)  PLOF: Independent  PATIENT GOALS: "get back to normal and get back to work"   Next MD visit: 04/27/2023 Dr. Magnus Ivan   OBJECTIVE:   DIAGNOSTIC FINDINGS:  FINDINGS: Interval total left knee arthroplasty. No perihardware lucency is seen to indicate hardware failure or loosening. Expected postoperative changes including intra-articular  and subcutaneous air. Smalljoint effusion. Anterior surgical skin staples. Mild chronic enthesopathic change at the quadriceps insertion on the patella. No acute fracture or dislocation.  PATIENT SURVEYS:   Patient-specific activity scoring scheme   "0" represents "unable to perform." "10" represents "able to perform at prior level. 0 1 2 3 4 5 6 7 8 9  10 (Date and Score) Activity Initial  Activity Eval 04/01/2023    Bending/straightening knee for don/doff shoes 0    2. Stairs in reciprocal pattern 0    3. Standing for  long periods of time 3   4.    5.     Total score = sum of the activity scores/number of activities Minimum detectable change (90%CI) for average score = 2 points Minimum detectable change (90%CI) for single activity score = 3 points  Score: 1/10   COGNITION: 04/01/2023 Overall cognitive status: WFL    SENSATION: 04/01/2023 [facility]  EDEMA:  04/01/2023 Circumferential: above knee: 45cm; below knee: 37.5cm  MUSCLE LENGTH: 04/01/2023 Not tested  POSTURE:  04/01/2023 No Significant postural limitations  PALPATION: 04/01/2023 Non-tender to calf/quad/hamstrings. Notable HS tightness.  LOWER EXTREMITY ROM:   ROM Left 04/01/2023 Left 04/02/2023 Left 04/06/23 Left 04/08/2023  Hip flexion      Hip extension      Hip abduction      Hip adduction      Hip internal rotation      Hip external rotation      Knee flexion A:72 P: not attempted due to patient pain and tightness Supine with strap AAROM:78 Supine A: 85 P: 90 Supine:  AAROM 85  Knee extension A: 4 P: 0 A:2  A: 2 P: 0   Ankle dorsiflexion      Ankle plantarflexion      Ankle inversion      Ankle eversion       (Blank rows = not tested)  LOWER EXTREMITY MMT:  MMT Right 04/01/2023 Left 04/01/2023  Hip flexion 5 5  Hip extension    Hip abduction    Hip adduction    Hip internal rotation    Hip external rotation    Knee flexion 5 3-  Knee extension 5 3-  Ankle dorsiflexion 5 5  Ankle plantarflexion    Ankle inversion    Ankle eversion     (Blank rows = not tested)  LOWER EXTREMITY SPECIAL TESTS:  04/01/2023 None tested  FUNCTIONAL TESTS:  04/01/2023 Not tested   GAIT: 04/01/2023 Distance walked: clinic distance Assistive device utilized: Single point cane Level of assistance: Modified independence Comments: decreased knee extension, decreased stance time on LLE, decreased step length, foot flat due to decreased ability to extend knee- patient able to DF appropriately as seen in MMT  TODAY'S TREATMENT                                                                          DATE:  04/08/23   Therex: Standing knee flexion lunge stretch with foot on 8in step 10x10sec Tailgate swings with emphasis on bending knee back x10  Seated knee flexion with opposite leg assist 10x5sec hold; vc for reducing left lateral lean Supine knee flexion with ball assist 10x10sec hold; vc for full knee extension between reps  Theract: Stair negotiation 11 steps ascending and descending using LLE with step-to pattern with 1 rail and SPC. ascending and descending with side step pattern using LLE with LUE on rail with SPC RUE SBA  PT instructed in above 2 activities for functional strength using LLE. Pt verbalized understanding.   Manual: Flexion with IR and distraction with overpressure to patient tolerance Flexion with IR and distraction contract relax  Vaso: to L knee, mod pressure, 34deg, BLE elevated   TREATMENT                                                                          DATE:  04/07/23   Scifit bike: seat 14, full revolutions, L3 x 8 minutes Standing knee flexion lunge stretch with foot on treadmill 5 sec X 10 Seated knee flexion AAROM 5 sec X 10 Calf stretch on slant board: x 3 holding 30 sec TherEx:  Sit to stand: 2 x 10  Leg Press:  bil LE's 75# 3 x 10, Left LE only 37# 2X10 Step ups on 6 inch step with one UE support, leading with left X 10 Lateral step ups on 6 inch step one UE support leading with left X 10 Manual therapy Left knee PROM with overpressure into flexion and extension  TREATMENT                                                                          DATE:  04/06/23   Scifit bike: seat 14, partial revolutions to full revolutions, L3 x 8 minutes seated heel slides:  2 x 10 holding 3-5 sec at end  range seated SLR: 2 x 10 Left LE Supine heel slides c stretch strap holding end range x 5 sec x 10 Calf stretch on slant board: x 3 holding 20 sec TherEx:  Sit to stand: 2 x 10  Leg Press:  bil LE's 75# 3 x 10, Left LE only x 10, 37# Step ups on 6 inch using hand held support and St cane x 15   TREATMENT  DATE:04/02/2023 Therex: Nustep level 3 seat 12 UE/LE use Heel slides x10 with strap then x10 with addition of ball for [employer] set x10 with 5sec hold  SAQ with ball under knee x15 with 3sec hold Mini squats at sink with chair behind for visual cue x15  Manual: Non-instrument and instrument assisted quad and medial/lateral knee massage to to address tightness Flexion with IR and distraction with overpressure to patient tolerance Flexion with IR and distraction contract relax   Vaso: to L knee, mod pressure, 32deg, BLE elevated with L ankle prop for facilitation of full extension   TREATMENT                                                                          DATE:04/01/2023 Therex:    HEP instruction/performance c cues for techniques, handout provided.  Trial set performed of each for comprehension and symptom assessment.  See below for exercise list. Increased time taken for question and answer.   Vaso: Lt knee, , 34deg, mod pressure, BLE elevated   PATIENT EDUCATION:  04/01/2023 Education details: HEP, POC Person educated: Patient Education method: Explanation, Demonstration, Verbal cues, and Handouts Education comprehension: verbalized understanding, returned demonstration, and verbal cues required  HOME EXERCISE PROGRAM: Access Code: [email or web address] URL: [email or web address] Date: 04/01/2023 Prepared by: Chyrel Masson   Exercises - Supine Heel Slide with Strap  - 3-5 x daily - 7 x weekly - 1 sets - 10 reps - 5-10 hold - Seated Knee Flexion AAROM (Mirrored)  - 3-5 x  daily - 7 x weekly - 1 sets - 5 reps - 10-15 hold - Quad Setting and Stretching  - 3-5 x daily - 7 x weekly - 1 sets - 10 reps - 5 hold - Seated Quad Set (Mirrored)  - 3-5 x daily - 7 x weekly - 1 sets - 10 reps - 5 hold - Seated Long Arc Quad (Mirrored)  - 3-5 x daily - 7 x weekly - 1 sets - 5-10 reps - 2 hold - Long Sitting Calf Stretch with Strap  - 2-3 x daily - 7 x weekly - 1 sets - 3-5 reps - 15-30 hold - Seated Gastroc Stretch with Strap (Mirrored)  - 2-3 x daily - 7 x weekly - 1 sets - 5 reps - 30 hold  ASSESSMENT:  CLINICAL IMPRESSION: Patient demonstrated good control with stair navigation requiring vc for appropriate weight shift and knee bend to lower. Continues to demonstrate deviations during gait due to decreased knee motion and overall LLE weakness. Patient will benefit from continued skilled physical therapy to address deficits.  OBJECTIVE IMPAIRMENTS: Abnormal gait, decreased activity tolerance, decreased balance, decreased coordination, decreased endurance, decreased knowledge of use of DME, decreased mobility, difficulty walking, decreased ROM, decreased strength, hypomobility, increased edema, impaired perceived functional ability, impaired flexibility, and pain.   ACTIVITY LIMITATIONS: carrying, lifting, bending, sitting, standing, squatting, stairs, and locomotion level  PARTICIPATION LIMITATIONS: meal prep, cleaning, laundry, personal finances, driving, shopping, community activity, occupation, and yard work  PERSONAL FACTORS: Time since onset of injury/illness/exacerbation and 1-2 comorbidities: see above  are also affecting patient's functional outcome.   REHAB POTENTIAL: Good  CLINICAL DECISION MAKING: Stable/uncomplicated  EVALUATION COMPLEXITY: Low  GOALS: Goals reviewed with patient? Yes  SHORT TERM GOALS: (target date for Short term goals are 3 weeks 04/22/2023)   1.  Patient will demonstrate independent use of home exercise program to maintain progress  from in clinic treatments.  Goal status: ongoing 04/08/2023  LONG TERM GOALS: (target dates for all long term goals are 10 weeks  06/10/2023 )   1. Patient will demonstrate/report pain at worst less than or equal to 2/10 to facilitate minimal limitation in daily activity secondary to pain symptoms.  Goal status: ongoing 04/08/2023   2. Patient will demonstrate independent use of home exercise program to facilitate ability to maintain/progress functional gains from skilled physical therapy services.  Goal status: ongoing 04/08/2023   3. Patient will demonstrate Patient specific functional scale avg > or = 8 to indicate reduced disability due to condition.   Goal status: ongoing 04/08/2023   4.  Patient will demonstrate Lt LE MMT 5/5 throughout to faciltiate usual transfers, stairs, squatting at [facility] for daily life.   Goal status: ongoing 04/08/2023   5.  Patient will demonstrate up and down a flight of stairs with single hand rail with reciprocal gait pattern Goal status: ongoing 04/08/2023   6.  Patient will demonstrate independent ambulation > 500 ft s deviation due to condition.   Goal status: ongoing 04/08/2023   7.  Patient will demonstrate AROM 0-115deg to facilitate usual mobility in daily activity at PLOF.  Goal Status: ongoing 04/08/2023   PLAN:  PT FREQUENCY: 2-3x/week (starting at 3x)  PT DURATION: 10 weeks  PLANNED INTERVENTIONS: Can include 16109- PT Re-evaluation, 97110-Therapeutic exercises, 97530- Therapeutic activity, 97112- Neuromuscular re-education, 97535- Self Care, 97140- Manual therapy, [phone]- Gait training, [id number]- Orthotic Fit/training, [phone]- Canalith repositioning, [id number]- Aquatic Therapy, [phone]- Electrical stimulation (unattended), [phone]- Electrical stimulation (manual), [id number] Physical performance testing, 97016- Vasopneumatic device, [id number]- Ultrasound, [id number]- Traction (mechanical), [id number]- Ionotophoresis 4mg /ml Dexamethasone, Patient/Family education, Balance training,  Stair training, Taping, Dry Needling, Joint mobilization, Joint manipulation, Spinal manipulation, Spinal mobilization, Scar mobilization, Vestibular training, Visual/preceptual remediation/compensation, DME instructions, Cryotherapy, and Moist heat.  All performed as medically necessary.  All included unless contraindicated  PLAN FOR NEXT SESSION: quad stretching (prone with strap) while also targeting knee ROM. Overall LE strengthening.     Naviyah Schaffert, Tonni Mansour, Student-PT 04/08/2023, 12:06 PM  This entire session of physical therapy was performed under the direct supervision of PT signing evaluation /treatment. PT reviewed note and agrees.   Vladimir Faster, PT, DPT 04/08/2023, 1:59 PM

## 2023-04-14 ENCOUNTER — Encounter: Payer: Self-pay | Admitting: Rehabilitative and Restorative Service Providers"

## 2023-04-14 ENCOUNTER — Ambulatory Visit (INDEPENDENT_AMBULATORY_CARE_PROVIDER_SITE_OTHER): Payer: BC Managed Care – PPO | Admitting: Rehabilitative and Restorative Service Providers"

## 2023-04-14 DIAGNOSIS — M6281 Muscle weakness (generalized): Secondary | ICD-10-CM | POA: Diagnosis not present

## 2023-04-14 DIAGNOSIS — R2689 Other abnormalities of gait and mobility: Secondary | ICD-10-CM

## 2023-04-14 DIAGNOSIS — M25662 Stiffness of left knee, not elsewhere classified: Secondary | ICD-10-CM

## 2023-04-14 DIAGNOSIS — R600 Localized edema: Secondary | ICD-10-CM

## 2023-04-14 DIAGNOSIS — G8929 Other chronic pain: Secondary | ICD-10-CM

## 2023-04-14 DIAGNOSIS — M25562 Pain in left knee: Secondary | ICD-10-CM

## 2023-04-14 NOTE — Therapy (Signed)
 OUTPATIENT PHYSICAL THERAPY TREATMENT   Patient Name: Gregory Howard MRN: [id number] DOB:1960/10/03, 63 y.o., male Today's Date: 04/14/2023  END OF SESSION:  PT End of Session - 04/14/23 1137     Visit Number 6    Number of Visits 25    Date for PT Re-Evaluation 06/11/23    Authorization Type [employer] 20% coinsurance    Progress Note Due on Visit 10    PT Start Time 1136    PT Stop Time 1239    PT Time Calculation (min) 63 min    Activity Tolerance Patient tolerated treatment well    Behavior During Therapy WFL for tasks assessed/performed                Past Medical History:  Diagnosis Date   Arthritis    History of kidney stones    Hypertension    Kidney stones    Pre-diabetes    Past Surgical History:  Procedure Laterality Date   EXTRACORPOREAL SHOCK WAVE LITHOTRIPSY Right 03/23/2017   Procedure: RIGHT EXTRACORPOREAL SHOCK WAVE LITHOTRIPSY (ESWL);  Surgeon: Marcine Matar, MD;  Location: [facility];  Service: Urology;  Laterality: Right;   HERNIA REPAIR     done as child   LITHOTRIPSY     TOTAL HIP ARTHROPLASTY Left 08/24/2020   Procedure: LEFT TOTAL HIP ARTHROPLASTY ANTERIOR APPROACH;  Surgeon: [name], MD;  Location: [facility];  Service: Orthopedics;  Laterality: Left;   TOTAL KNEE ARTHROPLASTY Left 03/13/2023   Procedure: LEFT TOTAL KNEE ARTHROPLASTY;  Surgeon: [name], MD;  Location: [facility];  Service: Orthopedics;  Laterality: Left;   Patient Active Problem List   Diagnosis Date Noted   Status post total left knee replacement 03/13/2023   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  Reported up to 5/10 at times in bending.  Reported getting some better sleeping.   PERTINENT HISTORY: Lt THA (2022), HTN, arthritis   PAIN:  NPRS scale: up to 5/10 Pain location: Lt knee Pain description: sharp  Aggravating factors: bending the knee Relieving factors: icing, meds, rest  PRECAUTIONS: None  WEIGHT BEARING RESTRICTIONS: No  FALLS:  Has patient fallen in last 6 months? No  LIVING ENVIRONMENT: Lives with: lives with their family and lives with their spouse Lives in: House/apartment Stairs: Yes: External: 3 steps; none Has following equipment at home: Single point cane, Walker - 2 wheeled, Marine scientist  OCCUPATION: short term disability; does Biomedical engineer. Does need to be able to squat/bend down/ stand for long periods of time and will be wearing heavy steel toe boots. Needs to go back to work 12 wks from now (around 1st or 2nd week of May)  PLOF: Independent  PATIENT GOALS: "get back to normal and get back to work"   Next MD visit: 04/27/2023 Dr. Magnus Ivan   OBJECTIVE:   DIAGNOSTIC FINDINGS:  FINDINGS: Interval total left knee arthroplasty. No perihardware lucency is seen to indicate hardware failure or loosening.  Expected postoperative changes including intra-articular and subcutaneous air. Smalljoint effusion. Anterior surgical skin staples. Mild chronic enthesopathic change at the quadriceps insertion on the patella. No acute fracture or dislocation.  PATIENT SURVEYS:   Patient-specific activity scoring scheme   "0" represents "unable to perform." "10" represents "able to perform at prior level. 0 1 2 3 4 5 6 7 8 9  10 (Date and Score) Activity Initial  Activity Eval 04/01/2023    Bending/straightening knee for don/doff  shoes 0    2. Stairs in reciprocal pattern 0    3. Standing for long periods of time 3   4.    5.     Total score = sum of the activity scores/number of activities Minimum detectable change (90%CI) for average score = 2 points Minimum detectable change (90%CI) for single activity score = 3 points  Score: 1/10   COGNITION: 04/01/2023 Overall cognitive status: WFL    SENSATION: 04/01/2023 [facility]  EDEMA:  04/01/2023 Circumferential: above knee: 45cm; below knee: 37.5cm  MUSCLE LENGTH: 04/01/2023 Not tested  POSTURE:  04/01/2023 No Significant postural limitations  PALPATION: 04/01/2023 Non-tender to calf/quad/hamstrings. Notable HS tightness.  LOWER EXTREMITY ROM:   ROM Left 04/01/2023 Left 04/02/2023 Left 04/06/23 Left 04/08/2023 Left 04/14/2023  Hip flexion       Hip extension       Hip abduction       Hip adduction       Hip internal rotation       Hip external rotation       Knee flexion A:72 P: not attempted due to patient pain and tightness Supine with strap AAROM:78 Supine A: 85 P: 90 Supine:  AAROM 85 Supine AROM heel slide 93   Knee extension A: 4 P: 0 A:2  A: 2 P: 0    Ankle dorsiflexion       Ankle plantarflexion       Ankle inversion       Ankle eversion        (Blank rows = not tested)  LOWER EXTREMITY MMT:  MMT Right 04/01/2023 Left 04/01/2023 Left 04/14/2023  Hip flexion 5 5   Hip extension     Hip abduction     Hip adduction     Hip internal rotation     Hip external rotation     Knee flexion 5 3-   Knee extension 5 3- 5/5 (in available range)  Ankle dorsiflexion 5 5   Ankle plantarflexion     Ankle inversion     Ankle eversion      (Blank rows = not tested)  LOWER EXTREMITY SPECIAL TESTS:  04/01/2023 None tested  FUNCTIONAL TESTS:  04/01/2023 Not tested   GAIT: 04/01/2023 Distance walked: clinic distance Assistive device utilized: Single point cane Level of assistance: Modified independence Comments: decreased knee  extension, decreased stance time on LLE, decreased step length, foot flat due to decreased ability to extend knee- patient able to DF appropriately as seen in MMT  TODAY'S TREATMENT                                                                          DATE:  04/14/2023 Therex: UBE LE only for ROM, seat 14 lvl 2.0 8 mins  Seated Lt knee flexion 30 sec hold with foot on ground 30 sec x 5 (discussed longer stretching in HEP) Incline gastroc stretch 30 sec x 3 bilaterally  Seated Lt leg LAQ 5 lbs with end range pauses 2 x 15 Supine Lt knee LAQ in 90 deg hip flexion x 10    Neuro Re-ed (to improve balance, muscle coordination) Tandem stance 1 min x 1 bilateral on ground,  on foam 1 min x 1 bilateral in // bars with occasional HHA.  Verbal cues for use and importance of proprioceptive improvements.    TherActivity:   (to improve stair navigation, transfers, squatting, ambulation) Step up fwd 6 inch step WB on Lt leg c reverse step down slowly x 15 with Rt hand on bar Lateral step down WB on Lt leg 4 inch step x 15 c slow lowering focus   Manual: Seated Lt knee flexion c distraction/ IR.  Contract /relax techniques for mobility gains.  Percussive device to Lt quad during.   Vaso: to Lt knee, medium pressure, 34deg, BLE elevated    TODAY'S TREATMENT                                                                          DATE:  04/08/23   Therex: Standing knee flexion lunge stretch with foot on 8in step 10x10sec Tailgate swings with emphasis on bending knee back x10  Seated knee flexion with opposite leg assist 10x5sec hold; vc for reducing left lateral lean Supine knee flexion with ball assist 10x10sec hold; vc for full knee extension between reps  Theract: Stair negotiation 11 steps ascending and descending using LLE  with step-to pattern with 1 rail and SPC. ascending and descending with side step pattern using LLE with LUE on rail with SPC RUE SBA  PT instructed in above 2 activities for functional strength using LLE. Pt verbalized understanding.   Manual: Flexion with IR and distraction with overpressure to patient tolerance Flexion with IR and distraction contract relax  Vaso: to L knee, mod pressure, 34deg, BLE elevated   TREATMENT                                                                          DATE:  04/07/23   Scifit bike: seat 14, full revolutions, L3 x 8 minutes Standing knee flexion lunge stretch with foot on treadmill 5 sec X 10 Seated knee flexion AAROM  5 sec X 10 Calf stretch on slant board: x 3 holding 30 sec TherEx:  Sit to stand: 2 x 10  Leg Press:  bil LE's 75# 3 x 10, Left LE only 37# 2X10 Step ups on 6 inch step with one UE support, leading with left X 10 Lateral step ups on 6 inch step one UE support leading with left X 10 Manual therapy Left knee PROM with overpressure into flexion and extension  TREATMENT                                                                          DATE:  04/06/23   Scifit bike: seat 14, partial revolutions to full revolutions, L3 x 8 minutes seated heel slides:  2 x 10 holding 3-5 sec at end range seated SLR: 2 x 10 Left LE Supine heel slides c stretch strap holding end range x 5 sec x 10 Calf stretch on slant board: x 3 holding 20 sec TherEx:  Sit to stand: 2 x 10  Leg Press:  bil LE's 75# 3 x 10, Left LE only x 10, 37# Step ups on 6 inch using hand held support and St cane x 15    PATIENT EDUCATION:  04/01/2023 Education details: HEP, POC Person educated: Patient Education method: Programmer, multimedia, Demonstration, Verbal cues, and Handouts Education comprehension: verbalized understanding, returned demonstration, and verbal cues required  HOME EXERCISE PROGRAM: Access Code: [email or web address] URL: [email or web address] Date:  04/01/2023 Prepared by: Chyrel Masson   Exercises - Supine Heel Slide with Strap  - 3-5 x daily - 7 x weekly - 1 sets - 10 reps - 5-10 hold - Seated Knee Flexion AAROM (Mirrored)  - 3-5 x daily - 7 x weekly - 1 sets - 5 reps - 10-15 hold - Quad Setting and Stretching  - 3-5 x daily - 7 x weekly - 1 sets - 10 reps - 5 hold - Seated Quad Set (Mirrored)  - 3-5 x daily - 7 x weekly - 1 sets - 10 reps - 5 hold - Seated Long Arc Quad (Mirrored)  - 3-5 x daily - 7 x weekly - 1 sets - 5-10 reps - 2 hold - Long Sitting Calf Stretch with Strap  - 2-3 x daily - 7 x weekly - 1 sets - 3-5 reps - 15-30 hold - Seated Gastroc Stretch with Strap (Mirrored)  - 2-3 x daily - 7 x weekly - 1 sets - 5 reps - 30 hold  ASSESSMENT:  CLINICAL IMPRESSION: Slow but steady improvement in active flexion noted on Lt knee to this point.  First time in AROM measurement above 90.  Continued combination of tightness in capsule and myofascial tightness/guarding noted as limit for mobility.  Continued skilled PT services warranted at this time to promote gains.   OBJECTIVE IMPAIRMENTS: Abnormal gait, decreased activity tolerance, decreased balance, decreased coordination, decreased endurance, decreased knowledge of use of DME, decreased mobility, difficulty walking, decreased ROM, decreased strength, hypomobility, increased edema, impaired perceived functional ability, impaired flexibility, and pain.   ACTIVITY LIMITATIONS: carrying, lifting, bending, sitting, standing, squatting, stairs, and locomotion level  PARTICIPATION LIMITATIONS: meal prep, cleaning, laundry, personal finances, driving, shopping, community activity, occupation, and yard work  PERSONAL FACTORS:  Time since onset of injury/illness/exacerbation and 1-2 comorbidities: see above  are also affecting patient's functional outcome.   REHAB POTENTIAL: Good  CLINICAL DECISION MAKING: Stable/uncomplicated  EVALUATION COMPLEXITY: Low   GOALS: Goals reviewed  with patient? Yes  SHORT TERM GOALS: (target date for Short term goals are 3 weeks 04/22/2023)   1.  Patient will demonstrate independent use of home exercise program to maintain progress from in clinic treatments.  Goal status: ongoing 04/08/2023  LONG TERM GOALS: (target dates for all long term goals are 10 weeks  06/10/2023 )   1. Patient will demonstrate/report pain at worst less than or equal to 2/10 to facilitate minimal limitation in daily activity secondary to pain symptoms.  Goal status: ongoing 04/08/2023   2. Patient will demonstrate independent use of home exercise program to facilitate ability to maintain/progress functional gains from skilled physical therapy services.  Goal status: ongoing 04/08/2023   3. Patient will demonstrate Patient specific functional scale avg > or = 8 to indicate reduced disability due to condition.   Goal status: ongoing 04/08/2023   4.  Patient will demonstrate Lt LE MMT 5/5 throughout to faciltiate usual transfers, stairs, squatting at [facility] for daily life.   Goal status: ongoing 04/08/2023   5.  Patient will demonstrate up and down a flight of stairs with single hand rail with reciprocal gait pattern Goal status: ongoing 04/08/2023   6.  Patient will demonstrate independent ambulation > 500 ft s deviation due to condition.   Goal status: ongoing 04/08/2023   7.  Patient will demonstrate AROM 0-115deg to facilitate usual mobility in daily activity at PLOF.  Goal Status: ongoing 04/08/2023   PLAN:  PT FREQUENCY: 2-3x/week (starting at 3x)  PT DURATION: 10 weeks  PLANNED INTERVENTIONS: Can include 04540- PT Re-evaluation, 97110-Therapeutic exercises, 97530- Therapeutic activity, 97112- Neuromuscular re-education, 97535- Self Care, 97140- Manual therapy, [phone]- Gait training, [phone]- Orthotic Fit/training, [phone]- Canalith repositioning, [id number]- Aquatic Therapy, [phone]- Electrical stimulation (unattended), [phone]- Electrical stimulation (manual), [id number]  Physical performance testing, 97016- Vasopneumatic device, [id number]- Ultrasound, [id number]- Traction (mechanical), [id number]- Ionotophoresis 4mg /ml Dexamethasone, Patient/Family education, Balance training, Stair training, Taping, Dry Needling, Joint mobilization, Joint manipulation, Spinal manipulation, Spinal mobilization, Scar mobilization, Vestibular training, Visual/preceptual remediation/compensation, DME instructions, Cryotherapy, and Moist heat.  All performed as medically necessary.  All included unless contraindicated  PLAN FOR NEXT SESSION: Percussive device and other muscle inhibiting strategy for flexion gains.    Chyrel Masson, PT, DPT, OCS, ATC 04/14/23  12:28 PM

## 2023-04-15 ENCOUNTER — Ambulatory Visit (INDEPENDENT_AMBULATORY_CARE_PROVIDER_SITE_OTHER): Payer: BC Managed Care – PPO | Admitting: Rehabilitative and Restorative Service Providers"

## 2023-04-15 ENCOUNTER — Other Ambulatory Visit: Payer: Self-pay | Admitting: Orthopaedic Surgery

## 2023-04-15 ENCOUNTER — Encounter: Payer: Self-pay | Admitting: Rehabilitative and Restorative Service Providers"

## 2023-04-15 DIAGNOSIS — R2689 Other abnormalities of gait and mobility: Secondary | ICD-10-CM | POA: Diagnosis not present

## 2023-04-15 DIAGNOSIS — M25662 Stiffness of left knee, not elsewhere classified: Secondary | ICD-10-CM

## 2023-04-15 DIAGNOSIS — M6281 Muscle weakness (generalized): Secondary | ICD-10-CM

## 2023-04-15 DIAGNOSIS — M25562 Pain in left knee: Secondary | ICD-10-CM

## 2023-04-15 DIAGNOSIS — G8929 Other chronic pain: Secondary | ICD-10-CM

## 2023-04-15 DIAGNOSIS — R600 Localized edema: Secondary | ICD-10-CM

## 2023-04-15 NOTE — Therapy (Signed)
 OUTPATIENT PHYSICAL THERAPY TREATMENT   Patient Name: Gregory Howard MRN: [id number] DOB:09-Oct-1960, 63 y.o., male Today's Date: 04/15/2023  END OF SESSION:  PT End of Session - 04/15/23 1148     Visit Number 7    Number of Visits 25    Date for PT Re-Evaluation 06/11/23    Authorization Type [employer] 20% coinsurance    Progress Note Due on Visit 10    PT Start Time 1142    PT Stop Time 1223    PT Time Calculation (min) 41 min    Activity Tolerance Patient tolerated treatment well    Behavior During Therapy WFL for tasks assessed/performed                Past Medical History:  Diagnosis Date   Arthritis    History of kidney stones    Hypertension    Kidney stones    Pre-diabetes    Past Surgical History:  Procedure Laterality Date   EXTRACORPOREAL SHOCK WAVE LITHOTRIPSY Right 03/23/2017   Procedure: RIGHT EXTRACORPOREAL SHOCK WAVE LITHOTRIPSY (ESWL);  Surgeon: Marcine Matar, MD;  Location: [facility];  Service: Urology;  Laterality: Right;   HERNIA REPAIR     done as child   LITHOTRIPSY     TOTAL HIP ARTHROPLASTY Left 08/24/2020   Procedure: LEFT TOTAL HIP ARTHROPLASTY ANTERIOR APPROACH;  Surgeon: [name], MD;  Location: [facility];  Service: Orthopedics;  Laterality: Left;   TOTAL KNEE ARTHROPLASTY Left 03/13/2023   Procedure: LEFT TOTAL KNEE ARTHROPLASTY;  Surgeon: [name], MD;  Location: [facility];  Service: Orthopedics;  Laterality: Left;   Patient Active Problem List   Diagnosis Date Noted   Status post total left knee replacement 03/13/2023   Status post total replacement of left hip 08/24/2020    PCP: Daisy Floro, MD  REFERRING PROVIDER: [name]  REFERRING DIAG:  S85.462 Status post total left knee replacement - Primary    THERAPY DIAG:  Chronic pain of left knee  Muscle weakness (generalized)  Stiffness of left knee, not elsewhere classified  Other abnormalities of gait and  mobility  Localized edema  Rationale for Evaluation and Treatment: Rehabilitation  ONSET DATE: 03/13/2023 L TKA  SUBJECTIVE:   SUBJECTIVE STATEMENT: Patient stating he feels good today. Still some stiffness but felt good after last session.   PERTINENT HISTORY: Lt THA (2022), HTN, arthritis   PAIN:  NPRS scale: up to 4/10 Pain location: Lt knee Pain description: sharp  Aggravating factors: bending the knee Relieving factors: icing, meds, rest  PRECAUTIONS: None  WEIGHT BEARING RESTRICTIONS: No  FALLS:  Has patient fallen in last 6 months? No  LIVING ENVIRONMENT: Lives with: lives with their family and lives with their spouse Lives in: House/apartment Stairs: Yes: External: 3 steps; none Has following equipment at home: Single point cane, Walker - 2 wheeled, Marine scientist  OCCUPATION: short term disability; does Biomedical engineer. Does need to be able to squat/bend down/ stand for long periods of time and will be wearing heavy steel toe boots. Needs to go back to work 12 wks from now (around 1st or 2nd week of May)  PLOF: Independent  PATIENT GOALS: "get back to normal and get back to work"   Next MD visit: 04/27/2023 Dr. Magnus Ivan   OBJECTIVE:   DIAGNOSTIC FINDINGS:  FINDINGS: Interval total left knee arthroplasty. No perihardware lucency is seen to indicate hardware failure or loosening. Expected postoperative changes including intra-articular and subcutaneous air. Smalljoint effusion.  Anterior surgical skin staples. Mild chronic enthesopathic change at the quadriceps insertion on the patella. No acute fracture or dislocation.  PATIENT SURVEYS:   Patient-specific activity scoring scheme   "0" represents "unable to perform." "10" represents "able to perform at prior level. 0 1 2 3 4 5 6 7 8 9  10 (Date and Score) Activity Initial  Activity Eval 04/01/2023    Bending/straightening knee for don/doff shoes 0    2. Stairs in reciprocal pattern 0    3.  Standing for long periods of time 3   4.    5.     Total score = sum of the activity scores/number of activities Minimum detectable change (90%CI) for average score = 2 points Minimum detectable change (90%CI) for single activity score = 3 points  Score: 1/10   COGNITION: 04/01/2023 Overall cognitive status: WFL    SENSATION: 04/01/2023 [facility]  EDEMA:  04/01/2023 Circumferential: above knee: 45cm; below knee: 37.5cm  MUSCLE LENGTH: 04/01/2023 Not tested  POSTURE:  04/01/2023 No Significant postural limitations  PALPATION: 04/01/2023 Non-tender to calf/quad/hamstrings. Notable HS tightness.  LOWER EXTREMITY ROM:   ROM Left 04/01/2023 Left 04/02/2023 Left 04/06/23 Left 04/08/2023 Left 04/14/2023 Left 04/15/2023  Hip flexion        Hip extension        Hip abduction        Hip adduction        Hip internal rotation        Hip external rotation        Knee flexion A:72 P: not attempted due to patient pain and tightness Supine with strap AAROM:78 Supine A: 85 P: 90 Supine:  AAROM 85 Supine AROM heel slide 93  Supine AAROM with strap 90  Knee extension A: 4 P: 0 A:2  A: 2 P: 0     Ankle dorsiflexion        Ankle plantarflexion        Ankle inversion        Ankle eversion         (Blank rows = not tested)  LOWER EXTREMITY MMT:  MMT Right 04/01/2023 Left 04/01/2023 Left 04/14/2023  Hip flexion 5 5   Hip extension     Hip abduction     Hip adduction     Hip internal rotation     Hip external rotation     Knee flexion 5 3-   Knee extension 5 3- 5/5 (in available range)  Ankle dorsiflexion 5 5   Ankle plantarflexion     Ankle inversion     Ankle eversion      (Blank rows = not tested)  LOWER EXTREMITY SPECIAL TESTS:  04/01/2023 None tested  FUNCTIONAL TESTS:  04/01/2023 Not tested   GAIT: 04/01/2023 Distance walked: clinic distance Assistive device utilized: Single point cane Level of assistance: Modified independence Comments: decreased knee  extension, decreased stance time on LLE, decreased step length, foot flat due to decreased ability to extend knee- patient able to DF appropriately as seen in MMT  TODAY'S TREATMENT                                                                          DATE:  04/15/2023 Therex: UBE LE only for ROM, seat 14 lvl 2.0 8 mins  Incline gastroc stretch 3x30sec    TherActivity: Anterior step ups 6in 2x10 Lateral step ups 2x10 Seated Lt leg LAQ 5 lbs with end range pauses 2 x 15 Seated knee flexion with overpressure 2x10  Manual: Flexion with IR and distraction with overpressure to patient tolerance Flexion with IR and distraction contract relax Percussive device to Lt quad  TREATMENT                                                                          DATE:  04/14/2023 Therex: UBE LE only for ROM, seat 14 lvl 2.0 8 mins  Seated Lt knee flexion 30 sec hold with foot on ground 30 sec x 5 (discussed longer stretching in HEP) Incline gastroc stretch 30 sec x 3 bilaterally  Seated Lt leg LAQ 5 lbs with end range pauses 2 x 15 Supine Lt knee LAQ in 90 deg hip flexion x 10    Neuro Re-ed (to improve balance, muscle coordination) Tandem stance 1 min x 1 bilateral on ground,  on foam 1 min x 1 bilateral in // bars with occasional HHA.  Verbal cues for use and importance of proprioceptive improvements.    TherActivity:   (to improve stair navigation, transfers, squatting, ambulation) Step up fwd 6 inch step WB on Lt leg c reverse step down slowly x 15 with Rt hand on bar Lateral step down WB on Lt leg 4 inch step x 15 c slow lowering focus   Manual: Seated Lt knee flexion c distraction/ IR.  Contract /relax techniques for mobility gains.  Percussive device to Lt quad during.   Vaso: to Lt knee, medium pressure, 34deg,  BLE elevated    TODAY'S TREATMENT                                                                          DATE:  04/08/23   Therex: Standing knee flexion lunge stretch with foot on 8in step 10x10sec Tailgate swings with emphasis on bending knee back x10  Seated knee flexion with opposite leg assist 10x5sec hold; vc for reducing left lateral lean Supine knee flexion with ball assist 10x10sec hold; vc for full knee extension between reps  Theract: Stair negotiation 11 steps ascending and descending using LLE with step-to pattern with 1 rail and SPC. ascending and descending with side step pattern using LLE with LUE on rail with SPC RUE SBA  PT instructed  in above 2 activities for functional strength using LLE. Pt verbalized understanding.   Manual: Flexion with IR and distraction with overpressure to patient tolerance Flexion with IR and distraction contract relax  Vaso: to L knee, mod pressure, 34deg, BLE elevated   TREATMENT                                                                          DATE:  04/07/23   Scifit bike: seat 14, full revolutions, L3 x 8 minutes Standing knee flexion lunge stretch with foot on treadmill 5 sec X 10 Seated knee flexion AAROM 5 sec X 10 Calf stretch on slant board: x 3 holding 30 sec TherEx:  Sit to stand: 2 x 10  Leg Press:  bil LE's 75# 3 x 10, Left LE only 37# 2X10 Step ups on 6 inch step with one UE support, leading with left X 10 Lateral step ups on 6 inch step one UE support leading with left X 10 Manual therapy Left knee PROM with overpressure into flexion and extension  TREATMENT                                                                          DATE:  04/06/23   Scifit bike: seat 14, partial revolutions to full revolutions, L3 x 8 minutes seated heel slides:  2 x 10 holding 3-5 sec at end range seated SLR: 2 x 10 Left LE Supine heel slides c stretch strap holding end range x 5 sec x 10 Calf stretch on slant board: x 3 holding  20 sec TherEx:  Sit to stand: 2 x 10  Leg Press:  bil LE's 75# 3 x 10, Left LE only x 10, 37# Step ups on 6 inch using hand held support and St cane x 15    PATIENT EDUCATION:  04/01/2023 Education details: HEP, POC Person educated: Patient Education method: Programmer, multimedia, Demonstration, Verbal cues, and Handouts Education comprehension: verbalized understanding, returned demonstration, and verbal cues required  HOME EXERCISE PROGRAM: Access Code: [email or web address] URL: [email or web address] Date: 04/01/2023 Prepared by: Chyrel Masson   Exercises - Supine Heel Slide with Strap  - 3-5 x daily - 7 x weekly - 1 sets - 10 reps - 5-10 hold - Seated Knee Flexion AAROM (Mirrored)  - 3-5 x daily - 7 x weekly - 1 sets - 5 reps - 10-15 hold - Quad Setting and Stretching  - 3-5 x daily - 7 x weekly - 1 sets - 10 reps - 5 hold - Seated Quad Set (Mirrored)  - 3-5 x daily - 7 x weekly - 1 sets - 10 reps - 5 hold - Seated Long Arc Quad (Mirrored)  - 3-5 x daily - 7 x weekly - 1 sets - 5-10 reps - 2 hold - Long Sitting Calf Stretch with Strap  - 2-3 x daily - 7 x weekly - 1 sets - 3-5 reps - 15-30 hold - Seated  Gastroc Stretch with Strap (Mirrored)  - 2-3 x daily - 7 x weekly - 1 sets - 5 reps - 30 hold  ASSESSMENT:  CLINICAL IMPRESSION: Patient tolerated activity well however knee stiffness continues to be an issue. Quad tightness may also be hindering flexion ROM progression as well. To address quad tightness, percussive device was done in addition to manual knee stretching. Patient will benefit from continued skilled physical therapy to address deficits and to promote functional gains.  OBJECTIVE IMPAIRMENTS: Abnormal gait, decreased activity tolerance, decreased balance, decreased coordination, decreased endurance, decreased knowledge of use of DME, decreased mobility, difficulty walking, decreased ROM, decreased strength, hypomobility, increased edema, impaired perceived functional ability,  impaired flexibility, and pain.   ACTIVITY LIMITATIONS: carrying, lifting, bending, sitting, standing, squatting, stairs, and locomotion level  PARTICIPATION LIMITATIONS: meal prep, cleaning, laundry, personal finances, driving, shopping, community activity, occupation, and yard work  PERSONAL FACTORS: Time since onset of injury/illness/exacerbation and 1-2 comorbidities: see above  are also affecting patient's functional outcome.   REHAB POTENTIAL: Good  CLINICAL DECISION MAKING: Stable/uncomplicated  EVALUATION COMPLEXITY: Low   GOALS: Goals reviewed with patient? Yes  SHORT TERM GOALS: (target date for Short term goals are 3 weeks 04/22/2023)   1.  Patient will demonstrate independent use of home exercise program to maintain progress from in clinic treatments.  Goal status: ongoing 04/08/2023  LONG TERM GOALS: (target dates for all long term goals are 10 weeks  06/10/2023 )   1. Patient will demonstrate/report pain at worst less than or equal to 2/10 to facilitate minimal limitation in daily activity secondary to pain symptoms.  Goal status: ongoing 04/08/2023   2. Patient will demonstrate independent use of home exercise program to facilitate ability to maintain/progress functional gains from skilled physical therapy services.  Goal status: ongoing 04/08/2023   3. Patient will demonstrate Patient specific functional scale avg > or = 8 to indicate reduced disability due to condition.   Goal status: ongoing 04/08/2023   4.  Patient will demonstrate Lt LE MMT 5/5 throughout to faciltiate usual transfers, stairs, squatting at [facility] for daily life.   Goal status: ongoing 04/08/2023   5.  Patient will demonstrate up and down a flight of stairs with single hand rail with reciprocal gait pattern Goal status: ongoing 04/08/2023   6.  Patient will demonstrate independent ambulation > 500 ft s deviation due to condition.   Goal status: ongoing 04/08/2023   7.  Patient will demonstrate AROM  0-115deg to facilitate usual mobility in daily activity at PLOF.  Goal Status: ongoing 04/08/2023   PLAN:  PT FREQUENCY: 2-3x/week (starting at 3x)  PT DURATION: 10 weeks  PLANNED INTERVENTIONS: Can include 19147- PT Re-evaluation, 97110-Therapeutic exercises, 97530- Therapeutic activity, 97112- Neuromuscular re-education, 97535- Self Care, 97140- Manual therapy, [phone]- Gait training, [phone]- Orthotic Fit/training, [phone]- Canalith repositioning, [id number]- Aquatic Therapy, [phone]- Electrical stimulation (unattended), [phone]- Electrical stimulation (manual), [id number] Physical performance testing, 97016- Vasopneumatic device, [id number]- Ultrasound, [id number]- Traction (mechanical), [id number]- Ionotophoresis 4mg /ml Dexamethasone, Patient/Family education, Balance training, Stair training, Taping, Dry Needling, Joint mobilization, Joint manipulation, Spinal manipulation, Spinal mobilization, Scar mobilization, Vestibular training, Visual/preceptual remediation/compensation, DME instructions, Cryotherapy, and Moist heat.  All performed as medically necessary.  All included unless contraindicated  PLAN FOR NEXT SESSION: continue quad stretching and percussive device, increase step height, continue knee flexion ROM activity   Hildur Bayer, Merced Brougham, Student-PT 04/15/2023, 12:29 PM

## 2023-04-17 ENCOUNTER — Encounter: Payer: Self-pay | Admitting: Physical Therapy

## 2023-04-17 ENCOUNTER — Ambulatory Visit: Payer: BC Managed Care – PPO | Admitting: Physical Therapy

## 2023-04-17 DIAGNOSIS — M25562 Pain in left knee: Secondary | ICD-10-CM

## 2023-04-17 DIAGNOSIS — M25662 Stiffness of left knee, not elsewhere classified: Secondary | ICD-10-CM

## 2023-04-17 DIAGNOSIS — R2689 Other abnormalities of gait and mobility: Secondary | ICD-10-CM | POA: Diagnosis not present

## 2023-04-17 DIAGNOSIS — M6281 Muscle weakness (generalized): Secondary | ICD-10-CM

## 2023-04-17 DIAGNOSIS — G8929 Other chronic pain: Secondary | ICD-10-CM

## 2023-04-17 DIAGNOSIS — R600 Localized edema: Secondary | ICD-10-CM

## 2023-04-17 NOTE — Therapy (Signed)
 OUTPATIENT PHYSICAL THERAPY TREATMENT   Patient Name: Gregory Howard MRN: [id number] DOB:1960/11/12, 63 y.o., male Today's Date: 04/17/2023  END OF SESSION:  PT End of Session - 04/17/23 1526     Visit Number 8    Number of Visits 25    Date for PT Re-Evaluation 06/11/23    Authorization Type [employer] 20% coinsurance    Progress Note Due on Visit 10    PT Start Time 1517    PT Stop Time 1557    PT Time Calculation (min) 40 min    Activity Tolerance Patient tolerated treatment well    Behavior During Therapy WFL for tasks assessed/performed                 Past Medical History:  Diagnosis Date   Arthritis    History of kidney stones    Hypertension    Kidney stones    Pre-diabetes    Past Surgical History:  Procedure Laterality Date   EXTRACORPOREAL SHOCK WAVE LITHOTRIPSY Right 03/23/2017   Procedure: RIGHT EXTRACORPOREAL SHOCK WAVE LITHOTRIPSY (ESWL);  Surgeon: Marcine Matar, MD;  Location: [facility];  Service: Urology;  Laterality: Right;   HERNIA REPAIR     done as child   LITHOTRIPSY     TOTAL HIP ARTHROPLASTY Left 08/24/2020   Procedure: LEFT TOTAL HIP ARTHROPLASTY ANTERIOR APPROACH;  Surgeon: [name], MD;  Location: [facility];  Service: Orthopedics;  Laterality: Left;   TOTAL KNEE ARTHROPLASTY Left 03/13/2023   Procedure: LEFT TOTAL KNEE ARTHROPLASTY;  Surgeon: [name], MD;  Location: [facility];  Service: Orthopedics;  Laterality: Left;   Patient Active Problem List   Diagnosis Date Noted   Status post total left knee replacement 03/13/2023   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 Still taking alieve and mm relaxors, got off the oxy as fast as I could   PERTINENT HISTORY: Lt THA (2022), HTN, arthritis   PAIN:  NPRS scale: 3-4/10 Pain location: Lt knee Pain description: sharp when at end range  Aggravating factors: bending the knee Relieving factors: icing, meds, rest  PRECAUTIONS: None  WEIGHT BEARING RESTRICTIONS: No  FALLS:  Has patient fallen in last 6 months? No  LIVING ENVIRONMENT: Lives with: lives with their family and lives with their spouse Lives in: House/apartment Stairs: Yes: External: 3 steps; none Has following equipment at home: Single point cane, Walker - 2 wheeled, Marine scientist  OCCUPATION: short term disability; does Biomedical engineer. Does need to be able to squat/bend down/ stand for long periods of time and will be wearing heavy steel toe boots. Needs to go back to work 12 wks from now (around 1st or 2nd week of May)  PLOF: Independent  PATIENT GOALS: "get back to normal and get back to work"   Next MD visit: 04/27/2023 Dr. Magnus Ivan   OBJECTIVE:   DIAGNOSTIC FINDINGS:  FINDINGS: Interval total left knee arthroplasty.  No perihardware lucency is seen to indicate hardware failure or loosening. Expected postoperative changes including intra-articular and subcutaneous air. Smalljoint effusion. Anterior surgical skin staples. Mild chronic enthesopathic change at the quadriceps insertion on the patella. No acute fracture or dislocation.  PATIENT SURVEYS:   Patient-specific activity scoring scheme   "0" represents "unable to perform." "10" represents "able to perform at prior level. 0 1 2 3 4 5 6 7 8 9  10 (Date and Score) Activity Initial  Activity Eval 04/01/2023    Bending/straightening  knee for don/doff shoes 0    2. Stairs in reciprocal pattern 0    3. Standing for long periods of time 3   4.    5.     Total score = sum of the activity scores/number of activities Minimum detectable change (90%CI) for average score = 2 points Minimum detectable change (90%CI) for single activity score = 3 points  Score: 1/10   COGNITION: 04/01/2023 Overall cognitive status: WFL    SENSATION: 04/01/2023 [facility]  EDEMA:  04/01/2023 Circumferential: above knee: 45cm; below knee: 37.5cm  MUSCLE LENGTH: 04/01/2023 Not tested  POSTURE:  04/01/2023 No Significant postural limitations  PALPATION: 04/01/2023 Non-tender to calf/quad/hamstrings. Notable HS tightness.  LOWER EXTREMITY ROM:   ROM Left 04/01/2023 Left 04/02/2023 Left 04/06/23 Left 04/08/2023 Left 04/14/2023 Left 04/15/2023  Hip flexion        Hip extension        Hip abduction        Hip adduction        Hip internal rotation        Hip external rotation        Knee flexion A:72 P: not attempted due to patient pain and tightness Supine with strap AAROM:78 Supine A: 85 P: 90 Supine:  AAROM 85 Supine AROM heel slide 93  Supine AAROM with strap 90  Knee extension A: 4 P: 0 A:2  A: 2 P: 0     Ankle dorsiflexion        Ankle plantarflexion        Ankle inversion        Ankle eversion         (Blank rows = not tested)  LOWER EXTREMITY MMT:  MMT Right 04/01/2023 Left 04/01/2023 Left 04/14/2023  Hip flexion 5 5   Hip extension     Hip abduction     Hip adduction     Hip internal rotation     Hip external rotation     Knee flexion 5 3-   Knee extension 5 3- 5/5 (in available range)  Ankle dorsiflexion 5 5   Ankle plantarflexion     Ankle inversion     Ankle eversion      (Blank rows = not tested)  LOWER EXTREMITY SPECIAL TESTS:  04/01/2023 None tested  FUNCTIONAL TESTS:  04/01/2023 Not tested   GAIT: 04/01/2023 Distance walked: clinic distance Assistive device utilized: Single point cane Level  of assistance: Modified independence Comments: decreased knee extension, decreased stance time on LLE, decreased step length, foot flat due to decreased ability to extend knee- patient able to DF appropriately as seen in MMT  TODAY'S TREATMENT                                                                          DATE:    04/17/23   Scifit bike x10 minutes full rotations seat 14x4 minutes full rotations for w/u and then progressed to seat 13 Shuttle BLE press 75# 12 focus on eccentric control and holds into flexion stretch Shuttle left LE press 25# x10  focus on eccentric control and holds into flexion     Knee flexion stretch 15x5 second holds  HS stretches on belt of TM L LE 2x30 seconds  Quad/hip flexor stretches 3x30 seconds supine    Percussion gun to L quad with LEs in elevation Knee flexion OP to tolerance seated edge of mat table         04/15/2023 Therex: UBE LE only for ROM, seat 14 lvl 2.0 8 mins  Incline gastroc stretch 3x30sec    TherActivity: Anterior step ups 6in 2x10 Lateral step ups 2x10 Seated Lt leg LAQ 5 lbs with end range pauses 2 x 15 Seated knee flexion with overpressure 2x10  Manual: Flexion with IR and distraction with overpressure to patient tolerance Flexion with IR and distraction contract relax Percussive device to Lt quad  TREATMENT                                                                          DATE:  04/14/2023 Therex: UBE LE only for ROM, seat 14 lvl 2.0 8 mins  Seated Lt knee flexion 30 sec hold with foot on ground 30 sec x 5 (discussed longer stretching in HEP) Incline gastroc stretch 30 sec x 3 bilaterally  Seated Lt leg LAQ 5 lbs with end range pauses 2 x 15 Supine Lt knee LAQ in 90 deg hip flexion x 10    Neuro Re-ed (to improve balance, muscle  coordination) Tandem stance 1 min x 1 bilateral on ground,  on foam 1 min x 1 bilateral in // bars with occasional HHA.  Verbal cues for use and importance of proprioceptive improvements.    TherActivity:   (to improve stair navigation, transfers, squatting, ambulation) Step up fwd 6 inch step WB on Lt leg c reverse step down slowly x 15 with Rt hand on bar Lateral step down WB on Lt leg 4 inch step x 15 c slow lowering focus   Manual: Seated Lt knee flexion c distraction/ IR.  Contract /relax techniques for mobility gains.  Percussive device to Lt quad during.   Vaso: to Lt knee, medium pressure, 34deg, BLE elevated    TODAY'S TREATMENT  DATE:  04/08/23   Therex: Standing knee flexion lunge stretch with foot on 8in step 10x10sec Tailgate swings with emphasis on bending knee back x10  Seated knee flexion with opposite leg assist 10x5sec hold; vc for reducing left lateral lean Supine knee flexion with ball assist 10x10sec hold; vc for full knee extension between reps  Theract: Stair negotiation 11 steps ascending and descending using LLE with step-to pattern with 1 rail and SPC. ascending and descending with side step pattern using LLE with LUE on rail with SPC RUE SBA  PT instructed in above 2 activities for functional strength using LLE. Pt verbalized understanding.   Manual: Flexion with IR and distraction with overpressure to patient tolerance Flexion with IR and distraction contract relax  Vaso: to L knee, mod pressure, 34deg, BLE elevated   TREATMENT                                                                          DATE:  04/07/23   Scifit bike: seat 14, full revolutions, L3 x 8 minutes Standing knee flexion lunge stretch with foot on treadmill 5 sec X 10 Seated knee flexion AAROM 5 sec X 10 Calf stretch on slant board: x 3 holding 30 sec TherEx:  Sit to stand: 2 x 10  Leg Press:  bil  LE's 75# 3 x 10, Left LE only 37# 2X10 Step ups on 6 inch step with one UE support, leading with left X 10 Lateral step ups on 6 inch step one UE support leading with left X 10 Manual therapy Left knee PROM with overpressure into flexion and extension  TREATMENT                                                                          DATE:  04/06/23   Scifit bike: seat 14, partial revolutions to full revolutions, L3 x 8 minutes seated heel slides:  2 x 10 holding 3-5 sec at end range seated SLR: 2 x 10 Left LE Supine heel slides c stretch strap holding end range x 5 sec x 10 Calf stretch on slant board: x 3 holding 20 sec TherEx:  Sit to stand: 2 x 10  Leg Press:  bil LE's 75# 3 x 10, Left LE only x 10, 37# Step ups on 6 inch using hand held support and St cane x 15    PATIENT EDUCATION:  04/01/2023 Education details: HEP, POC Person educated: Patient Education method: Programmer, multimedia, Demonstration, Verbal cues, and Handouts Education comprehension: verbalized understanding, returned demonstration, and verbal cues required  HOME EXERCISE PROGRAM: Access Code: [email or web address] URL: [email or web address] Date: 04/01/2023 Prepared by: Chyrel Masson   Exercises - Supine Heel Slide with Strap  - 3-5 x daily - 7 x weekly - 1 sets - 10 reps - 5-10 hold - Seated Knee Flexion AAROM (Mirrored)  - 3-5 x daily - 7 x weekly - 1 sets - 5 reps - 10-15 hold -  Quad Setting and Stretching  - 3-5 x daily - 7 x weekly - 1 sets - 10 reps - 5 hold - Seated Quad Set (Mirrored)  - 3-5 x daily - 7 x weekly - 1 sets - 10 reps - 5 hold - Seated Long Arc Quad (Mirrored)  - 3-5 x daily - 7 x weekly - 1 sets - 5-10 reps - 2 hold - Long Sitting Calf Stretch with Strap  - 2-3 x daily - 7 x weekly - 1 sets - 3-5 reps - 15-30 hold - Seated Gastroc Stretch with Strap (Mirrored)  - 2-3 x daily - 7 x weekly - 1 sets - 5 reps - 30 hold  ASSESSMENT:  CLINICAL IMPRESSION:  Pt arrives today doing OK, he is  working hard at home, continued to focus on and push ROM as appropriate and tolerated today. Knee flexion ROM is staying fairly limited but he did tolerate manual techniques well today. Will continue to push and challenge him as able.       OBJECTIVE IMPAIRMENTS: Abnormal gait, decreased activity tolerance, decreased balance, decreased coordination, decreased endurance, decreased knowledge of use of DME, decreased mobility, difficulty walking, decreased ROM, decreased strength, hypomobility, increased edema, impaired perceived functional ability, impaired flexibility, and pain.   ACTIVITY LIMITATIONS: carrying, lifting, bending, sitting, standing, squatting, stairs, and locomotion level  PARTICIPATION LIMITATIONS: meal prep, cleaning, laundry, personal finances, driving, shopping, community activity, occupation, and yard work  PERSONAL FACTORS: Time since onset of injury/illness/exacerbation and 1-2 comorbidities: see above  are also affecting patient's functional outcome.   REHAB POTENTIAL: Good  CLINICAL DECISION MAKING: Stable/uncomplicated  EVALUATION COMPLEXITY: Low   GOALS: Goals reviewed with patient? Yes  SHORT TERM GOALS: (target date for Short term goals are 3 weeks 04/22/2023)   1.  Patient will demonstrate independent use of home exercise program to maintain progress from in clinic treatments.  Goal status: ongoing 04/08/2023  LONG TERM GOALS: (target dates for all long term goals are 10 weeks  06/10/2023 )   1. Patient will demonstrate/report pain at worst less than or equal to 2/10 to facilitate minimal limitation in daily activity secondary to pain symptoms.  Goal status: ongoing 04/08/2023   2. Patient will demonstrate independent use of home exercise program to facilitate ability to maintain/progress functional gains from skilled physical therapy services.  Goal status: ongoing 04/08/2023   3. Patient will demonstrate Patient specific functional scale avg > or = 8 to  indicate reduced disability due to condition.   Goal status: ongoing 04/08/2023   4.  Patient will demonstrate Lt LE MMT 5/5 throughout to faciltiate usual transfers, stairs, squatting at [facility] for daily life.   Goal status: ongoing 04/08/2023   5.  Patient will demonstrate up and down a flight of stairs with single hand rail with reciprocal gait pattern Goal status: ongoing 04/08/2023   6.  Patient will demonstrate independent ambulation > 500 ft s deviation due to condition.   Goal status: ongoing 04/08/2023   7.  Patient will demonstrate AROM 0-115deg to facilitate usual mobility in daily activity at PLOF.  Goal Status: ongoing 04/08/2023   PLAN:  PT FREQUENCY: 2-3x/week (starting at 3x)  PT DURATION: 10 weeks  PLANNED INTERVENTIONS: Can include 13086- PT Re-evaluation, 97110-Therapeutic exercises, 97530- Therapeutic activity, [id number]- Neuromuscular re-education, 97535- Self Care, 97140- Manual therapy, [id number]- Gait training, [id number]- Orthotic Fit/training, [phone]- Canalith repositioning, [id number]- Aquatic Therapy, M8413- Electrical stimulation (unattended), [id number]- Electrical stimulation (manual), [id number] Physical performance testing, 97016- Vasopneumatic device,  81191- Ultrasound, 47829- Traction (mechanical), [id number]- Ionotophoresis 4mg /ml Dexamethasone, Patient/Family education, Balance training, Stair training, Taping, Dry Needling, Joint mobilization, Joint manipulation, Spinal manipulation, Spinal mobilization, Scar mobilization, Vestibular training, Visual/preceptual remediation/compensation, DME instructions, Cryotherapy, and Moist heat.  All performed as medically necessary.  All included unless contraindicated  PLAN FOR NEXT SESSION: continue quad stretching and percussive device, increase step height, continue knee flexion ROM activity, manual as tolerated and appropriate   Nedra Hai, PT, DPT 04/17/23 4:02 PM

## 2023-04-20 ENCOUNTER — Encounter: Payer: Self-pay | Admitting: Rehabilitative and Restorative Service Providers"

## 2023-04-20 ENCOUNTER — Ambulatory Visit (INDEPENDENT_AMBULATORY_CARE_PROVIDER_SITE_OTHER): Payer: BC Managed Care – PPO | Admitting: Rehabilitative and Restorative Service Providers"

## 2023-04-20 DIAGNOSIS — R2689 Other abnormalities of gait and mobility: Secondary | ICD-10-CM | POA: Diagnosis not present

## 2023-04-20 DIAGNOSIS — R600 Localized edema: Secondary | ICD-10-CM

## 2023-04-20 DIAGNOSIS — M25562 Pain in left knee: Secondary | ICD-10-CM | POA: Diagnosis not present

## 2023-04-20 DIAGNOSIS — M6281 Muscle weakness (generalized): Secondary | ICD-10-CM | POA: Diagnosis not present

## 2023-04-20 DIAGNOSIS — M25662 Stiffness of left knee, not elsewhere classified: Secondary | ICD-10-CM

## 2023-04-20 DIAGNOSIS — G8929 Other chronic pain: Secondary | ICD-10-CM

## 2023-04-20 NOTE — Therapy (Cosign Needed)
 OUTPATIENT PHYSICAL THERAPY TREATMENT   Patient Name: Gregory Howard MRN: [id number] DOB:05-30-1960, 63 y.o., male Today's Date: 04/20/2023  END OF SESSION:  PT End of Session - 04/20/23 1340     Visit Number 9    Number of Visits 25    Date for PT Re-Evaluation 06/11/23    Authorization Type [employer] 20% coinsurance    Progress Note Due on Visit 10    PT Start Time 1341    PT Stop Time 1434    PT Time Calculation (min) 53 min    Activity Tolerance Patient tolerated treatment well    Behavior During Therapy WFL for tasks assessed/performed              Past Medical History:  Diagnosis Date   Arthritis    History of kidney stones    Hypertension    Kidney stones    Pre-diabetes    Past Surgical History:  Procedure Laterality Date   EXTRACORPOREAL SHOCK WAVE LITHOTRIPSY Right 03/23/2017   Procedure: RIGHT EXTRACORPOREAL SHOCK WAVE LITHOTRIPSY (ESWL);  Surgeon: Marcine Matar, MD;  Location: [facility];  Service: Urology;  Laterality: Right;   HERNIA REPAIR     done as child   LITHOTRIPSY     TOTAL HIP ARTHROPLASTY Left 08/24/2020   Procedure: LEFT TOTAL HIP ARTHROPLASTY ANTERIOR APPROACH;  Surgeon: [name], MD;  Location: [facility];  Service: Orthopedics;  Laterality: Left;   TOTAL KNEE ARTHROPLASTY Left 03/13/2023   Procedure: LEFT TOTAL KNEE ARTHROPLASTY;  Surgeon: [name], MD;  Location: [facility];  Service: Orthopedics;  Laterality: Left;   Patient Active Problem List   Diagnosis Date Noted   Status post total left knee replacement 03/13/2023   Status post total replacement of left hip 08/24/2020    PCP: Daisy Floro, MD  REFERRING PROVIDER: [name]  REFERRING DIAG:  N82.956 Status post total left knee replacement - Primary    THERAPY DIAG:  Chronic pain of left knee  Muscle weakness (generalized)  Stiffness of left knee, not elsewhere classified  Other abnormalities of gait and mobility  Localized  edema  Rationale for Evaluation and Treatment: Rehabilitation  ONSET DATE: 03/13/2023 L TKA  SUBJECTIVE:   SUBJECTIVE STATEMENT: States that he is doing ok, feels like his knee is bending more.   PERTINENT HISTORY: Lt THA (2022), HTN, arthritis   PAIN:  NPRS scale: 3-4/10 Pain location: Lt knee Pain description: sharp when at end range  Aggravating factors: bending the knee Relieving factors: icing, meds, rest  PRECAUTIONS: None  WEIGHT BEARING RESTRICTIONS: No  FALLS:  Has patient fallen in last 6 months? No  LIVING ENVIRONMENT: Lives with: lives with their family and lives with their spouse Lives in: House/apartment Stairs: Yes: External: 3 steps; none Has following equipment at home: Single point cane, Walker - 2 wheeled, Marine scientist  OCCUPATION: short term disability; does Biomedical engineer. Does need to be able to squat/bend down/ stand for long periods of time and will be wearing heavy steel toe boots. Needs to go back to work 12 wks from now (around 1st or 2nd week of May)  PLOF: Independent  PATIENT GOALS: "get back to normal and get back to work"   Next MD visit: 04/27/2023 Dr. Magnus Ivan   OBJECTIVE:   DIAGNOSTIC FINDINGS:  FINDINGS: Interval total left knee arthroplasty. No perihardware lucency is seen to indicate hardware failure or loosening. Expected postoperative changes including intra-articular and subcutaneous air. Smalljoint effusion. Anterior surgical  skin staples. Mild chronic enthesopathic change at the quadriceps insertion on the patella. No acute fracture or dislocation.  PATIENT SURVEYS:   Patient-specific activity scoring scheme   "0" represents "unable to perform." "10" represents "able to perform at prior level. 0 1 2 3 4 5 6 7 8 9  10 (Date and Score) Activity Initial  Activity Eval 04/01/2023    Bending/straightening knee for don/doff shoes 0    2. Stairs in reciprocal pattern 0    3. Standing for long periods of time 3   4.     5.     Total score = sum of the activity scores/number of activities Minimum detectable change (90%CI) for average score = 2 points Minimum detectable change (90%CI) for single activity score = 3 points  Score: 1/10   COGNITION: 04/01/2023 Overall cognitive status: WFL    SENSATION: 04/01/2023 [facility]  EDEMA:  04/01/2023 Circumferential: above knee: 45cm; below knee: 37.5cm  MUSCLE LENGTH: 04/01/2023 Not tested  POSTURE:  04/01/2023 No Significant postural limitations  PALPATION: 04/01/2023 Non-tender to calf/quad/hamstrings. Notable HS tightness.  LOWER EXTREMITY ROM:   ROM Left 04/01/2023 Left 04/02/2023 Left 04/06/23 Left 04/08/2023 Left 04/14/2023 Left 04/15/2023  Hip flexion        Hip extension        Hip abduction        Hip adduction        Hip internal rotation        Hip external rotation        Knee flexion A:72 P: not attempted due to patient pain and tightness Supine with strap AAROM:78 Supine A: 85 P: 90 Supine:  AAROM 85 Supine AROM heel slide 93  Supine AAROM with strap 90  Knee extension A: 4 P: 0 A:2  A: 2 P: 0     Ankle dorsiflexion        Ankle plantarflexion        Ankle inversion        Ankle eversion         (Blank rows = not tested)  LOWER EXTREMITY MMT:  MMT Right 04/01/2023 Left 04/01/2023 Left 04/14/2023  Hip flexion 5 5   Hip extension     Hip abduction     Hip adduction     Hip internal rotation     Hip external rotation     Knee flexion 5 3-   Knee extension 5 3- 5/5 (in available range)  Ankle dorsiflexion 5 5   Ankle plantarflexion     Ankle inversion     Ankle eversion      (Blank rows = not tested)  LOWER EXTREMITY SPECIAL TESTS:  04/01/2023 None tested  FUNCTIONAL TESTS:  04/01/2023 Not tested   GAIT: 04/20/2023: Able to perform household distances in clinic without [facility] but SPC use recommended in community still to offload LLE  04/01/2023 Distance walked: clinic distance Assistive device utilized: Single  point cane Level of assistance: Modified independence Comments: decreased knee extension, decreased stance time on LLE, decreased step length, foot flat due to decreased ability to extend knee- patient able to DF appropriately as seen in MMT  TREATMENT                                                                          DATE:  04/20/2023 Therex: Scifit seat 14, level 3, LE only for ROM Leg press press BUE 75# 2x10; vc for quad set and increased ROM Leg press LLE 25# 2x10; vc for quad set and reaching increased flexion ROM   Theract: Anterior step ups 8in 2x10 Lateral step ups 8in 2x10 Seated knee flexion Active assist  2x10  Gait: Patient ambulated 22ft with cane with vc for increased heel contact and terminal knee extension throughout then ambulated 53ft without cane demonstrating good carryover. Continues to lack flexion  Manual: Flexion with IR and distraction with overpressure with contract relax Scar mobilization over healed incision points; 2 steri strips remain intact Percussive device to Lt quad while patient performed active assist knee flexion  Vaso: to Lt knee, medium pressure, 34deg, BLE elevated   TREATMENT                                                                          DATE:  04/17/23 Scifit bike x10 minutes full rotations seat 14x4 minutes full rotations for w/u and then progressed to seat 13 Shuttle BLE press 75# 12 focus on eccentric control and holds into flexion stretch Shuttle left LE press 25# x10  focus on eccentric control and holds into flexion   Knee flexion stretch 15x5 second holds  HS stretches on belt of TM L LE 2x30 seconds  Quad/hip flexor stretches 3x30 seconds supine    Percussion gun to L quad with LEs in elevation Knee flexion OP to tolerance seated edge of  mat table   TREATMENT                                                                          DATE:   04/15/2023 Therex: UBE LE only for ROM, seat 14 lvl 2.0 8 mins  Incline gastroc stretch 3x30sec    TherActivity: Anterior step ups 6in 2x10 Lateral step ups 2x10 Seated Lt leg LAQ 5 lbs with end range pauses 2 x 15 Seated knee flexion with overpressure 2x10  Manual: Flexion with IR and distraction with overpressure to patient tolerance Flexion with IR and distraction contract relax Percussive device to Lt quad  TREATMENT  DATE:  04/14/2023 Therex: UBE LE only for ROM, seat 14 lvl 2.0 8 mins  Seated Lt knee flexion 30 sec hold with foot on ground 30 sec x 5 (discussed longer stretching in HEP) Incline gastroc stretch 30 sec x 3 bilaterally  Seated Lt leg LAQ 5 lbs with end range pauses 2 x 15 Supine Lt knee LAQ in 90 deg hip flexion x 10    Neuro Re-ed (to improve balance, muscle coordination) Tandem stance 1 min x 1 bilateral on ground,  on foam 1 min x 1 bilateral in // bars with occasional HHA.  Verbal cues for use and importance of proprioceptive improvements.    TherActivity:   (to improve stair navigation, transfers, squatting, ambulation) Step up fwd 6 inch step WB on Lt leg c reverse step down slowly x 15 with Rt hand on bar Lateral step down WB on Lt leg 4 inch step x 15 c slow lowering focus   Manual: Seated Lt knee flexion c distraction/ IR.  Contract /relax techniques for mobility gains.  Percussive device to Lt quad during.   Vaso: to Lt knee, medium pressure, 34deg, BLE elevated   PATIENT EDUCATION:  04/01/2023 Education details: HEP, POC Person educated: Patient Education method: Explanation, Demonstration, Verbal cues, and Handouts Education comprehension: verbalized understanding, returned demonstration, and verbal cues required  HOME EXERCISE PROGRAM: Access Code:  [email or web address] URL: [email or web address] Date: 04/01/2023 Prepared by: Chyrel Masson   Exercises - Supine Heel Slide with Strap  - 3-5 x daily - 7 x weekly - 1 sets - 10 reps - 5-10 hold - Seated Knee Flexion AAROM (Mirrored)  - 3-5 x daily - 7 x weekly - 1 sets - 5 reps - 10-15 hold - Quad Setting and Stretching  - 3-5 x daily - 7 x weekly - 1 sets - 10 reps - 5 hold - Seated Quad Set (Mirrored)  - 3-5 x daily - 7 x weekly - 1 sets - 10 reps - 5 hold - Seated Long Arc Quad (Mirrored)  - 3-5 x daily - 7 x weekly - 1 sets - 5-10 reps - 2 hold - Long Sitting Calf Stretch with Strap  - 2-3 x daily - 7 x weekly - 1 sets - 3-5 reps - 15-30 hold - Seated Gastroc Stretch with Strap (Mirrored)  - 2-3 x daily - 7 x weekly - 1 sets - 5 reps - 30 hold  ASSESSMENT:  CLINICAL IMPRESSION: Patient did well with activity however knee flexion ROM continues to prove difficult for patient. Patient tolerated leg press requiring cuing for quad set and encouragement to bend knee as much as possible. Patient showing slow progress however continues to work hard during sessions and will benefit from continued skilled physical therapy.    OBJECTIVE IMPAIRMENTS: Abnormal gait, decreased activity tolerance, decreased balance, decreased coordination, decreased endurance, decreased knowledge of use of DME, decreased mobility, difficulty walking, decreased ROM, decreased strength, hypomobility, increased edema, impaired perceived functional ability, impaired flexibility, and pain.   ACTIVITY LIMITATIONS: carrying, lifting, bending, sitting, standing, squatting, stairs, and locomotion level  PARTICIPATION LIMITATIONS: meal prep, cleaning, laundry, personal finances, driving, shopping, community activity, occupation, and yard work  PERSONAL FACTORS: Time since onset of injury/illness/exacerbation and 1-2 comorbidities: see above  are also affecting patient's functional outcome.   REHAB POTENTIAL:  Good  CLINICAL DECISION MAKING: Stable/uncomplicated  EVALUATION COMPLEXITY: Low   GOALS: Goals reviewed with patient? Yes  SHORT TERM GOALS: (target date for Short term goals are  3 weeks 04/22/2023)   1.  Patient will demonstrate independent use of home exercise program to maintain progress from in clinic treatments.  Goal status: ongoing 04/08/2023  LONG TERM GOALS: (target dates for all long term goals are 10 weeks  06/10/2023 )   1. Patient will demonstrate/report pain at worst less than or equal to 2/10 to facilitate minimal limitation in daily activity secondary to pain symptoms.  Goal status: ongoing 04/08/2023   2. Patient will demonstrate independent use of home exercise program to facilitate ability to maintain/progress functional gains from skilled physical therapy services.  Goal status: ongoing 04/08/2023   3. Patient will demonstrate Patient specific functional scale avg > or = 8 to indicate reduced disability due to condition.   Goal status: ongoing 04/08/2023   4.  Patient will demonstrate Lt LE MMT 5/5 throughout to faciltiate usual transfers, stairs, squatting at [facility] for daily life.   Goal status: ongoing 04/08/2023   5.  Patient will demonstrate up and down a flight of stairs with single hand rail with reciprocal gait pattern Goal status: ongoing 04/08/2023   6.  Patient will demonstrate independent ambulation > 500 ft s deviation due to condition.   Goal status: ongoing 04/08/2023   7.  Patient will demonstrate AROM 0-115deg to facilitate usual mobility in daily activity at PLOF.  Goal Status: ongoing 04/08/2023   PLAN:  PT FREQUENCY: 2-3x/week (starting at 3x)  PT DURATION: 10 weeks  PLANNED INTERVENTIONS: Can include 01601- PT Re-evaluation, 97110-Therapeutic exercises, 97530- Therapeutic activity, 97112- Neuromuscular re-education, 97535- Self Care, 97140- Manual therapy, [phone]- Gait training, [phone]- Orthotic Fit/training, [phone]- Canalith repositioning, [id number]-  Aquatic Therapy, [phone]- Electrical stimulation (unattended), [phone]- Electrical stimulation (manual), [id number] Physical performance testing, 97016- Vasopneumatic device, [id number]- Ultrasound, [id number]- Traction (mechanical), [id number]- Ionotophoresis 4mg /ml Dexamethasone, Patient/Family education, Balance training, Stair training, Taping, Dry Needling, Joint mobilization, Joint manipulation, Spinal manipulation, Spinal mobilization, Scar mobilization, Vestibular training, Visual/preceptual remediation/compensation, DME instructions, Cryotherapy, and Moist heat.  All performed as medically necessary.  All included unless contraindicated  PLAN FOR NEXT SESSION: 10th visit progress note,  continue quad stretching and percussive device, increase step height, continue knee flexion ROM activity, manual as tolerated and appropriate    Callie Facey, Karas Pickerill, Student-PT 04/20/23 2:26 PM

## 2023-04-23 ENCOUNTER — Encounter: Payer: Self-pay | Admitting: Rehabilitative and Restorative Service Providers"

## 2023-04-23 ENCOUNTER — Ambulatory Visit (INDEPENDENT_AMBULATORY_CARE_PROVIDER_SITE_OTHER): Payer: BC Managed Care – PPO | Admitting: Rehabilitative and Restorative Service Providers"

## 2023-04-23 DIAGNOSIS — R600 Localized edema: Secondary | ICD-10-CM

## 2023-04-23 DIAGNOSIS — R2689 Other abnormalities of gait and mobility: Secondary | ICD-10-CM | POA: Diagnosis not present

## 2023-04-23 DIAGNOSIS — M25562 Pain in left knee: Secondary | ICD-10-CM

## 2023-04-23 DIAGNOSIS — M6281 Muscle weakness (generalized): Secondary | ICD-10-CM

## 2023-04-23 DIAGNOSIS — M25662 Stiffness of left knee, not elsewhere classified: Secondary | ICD-10-CM | POA: Diagnosis not present

## 2023-04-23 DIAGNOSIS — G8929 Other chronic pain: Secondary | ICD-10-CM

## 2023-04-23 NOTE — Therapy (Signed)
 OUTPATIENT PHYSICAL THERAPY TREATMENT / PROGRESS NOTE   Patient Name: Gregory Howard MRN: [id number] DOB:02-06-1960, 63 y.o., male Today's Date: 04/23/2023  Progress Note Reporting Period 04/01/2023 to 04/23/2023  See note below for Objective Data and Assessment of Progress/Goals.      END OF SESSION:  PT End of Session - 04/23/23 1426     Visit Number 10    Number of Visits 25    Date for PT Re-Evaluation 06/11/23    Authorization Type [employer] 20% coinsurance    Progress Note Due on Visit 20    PT Start Time 1423    PT Stop Time 1515    PT Time Calculation (min) 52 min    Activity Tolerance Patient tolerated treatment well    Behavior During Therapy WFL for tasks assessed/performed               Past Medical History:  Diagnosis Date   Arthritis    History of kidney stones    Hypertension    Kidney stones    Pre-diabetes    Past Surgical History:  Procedure Laterality Date   EXTRACORPOREAL SHOCK WAVE LITHOTRIPSY Right 03/23/2017   Procedure: RIGHT EXTRACORPOREAL SHOCK WAVE LITHOTRIPSY (ESWL);  Surgeon: Marcine Matar, MD;  Location: [facility];  Service: Urology;  Laterality: Right;   HERNIA REPAIR     done as child   LITHOTRIPSY     TOTAL HIP ARTHROPLASTY Left 08/24/2020   Procedure: LEFT TOTAL HIP ARTHROPLASTY ANTERIOR APPROACH;  Surgeon: [name], MD;  Location: [facility];  Service: Orthopedics;  Laterality: Left;   TOTAL KNEE ARTHROPLASTY Left 03/13/2023   Procedure: LEFT TOTAL KNEE ARTHROPLASTY;  Surgeon: [name], MD;  Location: [facility];  Service: Orthopedics;  Laterality: Left;   Patient Active Problem List   Diagnosis Date Noted   Status post total left knee replacement 03/13/2023   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  Main pain is end range pain.  Pt indicated overall improvement to normal at 70%.    PERTINENT HISTORY: Lt THA (2022), HTN, arthritis   PAIN:  NPRS scale: at worst in last few days 7/10 Pain location: Lt knee Pain description: sharp when at end range  Aggravating factors: bending the knee Relieving factors: icing, meds, rest  PRECAUTIONS: None  WEIGHT BEARING RESTRICTIONS: No  FALLS:  Has patient fallen in last 6 months? No  LIVING ENVIRONMENT: Lives with: lives with their family and lives with their spouse Lives in: House/apartment Stairs: Yes: External: 3 steps; none Has following equipment at home: Single point cane, Walker - 2 wheeled, Marine scientist  OCCUPATION: short term disability; does Biomedical engineer. Does need to be able to squat/bend down/ stand for long periods of time and will be wearing heavy steel toe boots. Needs to go back to work 12 wks from now (around 1st or 2nd week of May)  PLOF: Independent  PATIENT GOALS: "get back to normal and get back to work"  Next MD visit: 04/27/2023 Dr. Magnus Ivan   OBJECTIVE:   DIAGNOSTIC FINDINGS:  FINDINGS: Interval total left knee arthroplasty. No perihardware lucency is seen to indicate hardware failure or loosening. Expected postoperative changes including intra-articular and subcutaneous air. Smalljoint effusion. Anterior surgical skin staples. Mild chronic enthesopathic change at the quadriceps insertion on the patella. No acute fracture or dislocation.  PATIENT SURVEYS:   Patient-specific activity scoring scheme   "0" represents "unable to perform." "10" represents "able to  perform at prior level. 0 1 2 3 4 5 6 7 8 9  10 (Date and Score) Activity Initial  Activity Eval 04/01/2023 04/23/2023   Bending/straightening knee for don/doff shoes 0    2. Stairs in reciprocal pattern 0    3. Standing for long periods of time 3   4.    5.     Total score = sum of the activity scores/number of activities Minimum detectable change (90%CI) for average score = 2 points Minimum detectable change (90%CI) for single activity score = 3 points  Score: 1/10   COGNITION: 04/01/2023 Overall cognitive status: WFL    SENSATION: 04/01/2023 [facility]  EDEMA:  04/01/2023 Circumferential: above knee: 45cm; below knee: 37.5cm  MUSCLE LENGTH: 04/01/2023 Not tested  POSTURE:  04/01/2023 No Significant postural limitations  PALPATION: 04/01/2023 Non-tender to calf/quad/hamstrings. Notable HS tightness.  LOWER EXTREMITY ROM:   ROM Left 04/01/2023 Left 04/02/2023 Left 04/06/23 Left 04/08/2023 Left 04/14/2023 Left 04/15/2023 Left 04/23/2023  Hip flexion         Hip extension         Hip abduction         Hip adduction         Hip internal rotation         Hip external rotation         Knee flexion A:72 P: not attempted due to patient pain and tightness Supine with strap AAROM:78 Supine A: 85 P: 90 Supine:  AAROM 85 Supine AROM heel slide 93  Supine AAROM with strap 90 Supine AROM heel slide 95  Knee extension A: 4 P: 0 A:2  A: 2 P: 0      Ankle dorsiflexion         Ankle plantarflexion         Ankle inversion         Ankle eversion          (Blank rows = not tested)  LOWER EXTREMITY MMT:  MMT Right 04/01/2023 Left 04/01/2023 Left 04/14/2023  Hip flexion 5 5   Hip extension     Hip abduction     Hip adduction     Hip internal rotation     Hip external rotation     Knee flexion 5 3-   Knee extension 5 3- 5/5 (in available range)  Ankle dorsiflexion 5 5   Ankle plantarflexion     Ankle inversion     Ankle eversion      (Blank rows = not tested)  LOWER  EXTREMITY SPECIAL TESTS:  04/01/2023 None tested  FUNCTIONAL TESTS:  04/01/2023 Not tested   GAIT: 04/20/2023: Able to perform household distances in clinic without [facility] but SPC use recommended in community still to offload LLE  04/01/2023 Distance walked: clinic distance Assistive device utilized: Single point cane Level of assistance: Modified independence Comments: decreased knee extension, decreased stance time on LLE, decreased step length, foot flat due to decreased ability to extend knee- patient able to DF appropriately as seen in MMT  TREATMENT                                                                          DATE:  04/23/2023 Therex: UBE LE only for ROM, seat 11 10 mins lvl 3.0 Supine AROM heel slide 5 sec hold x 10  Knee extension machine double leg up single leg down Lt leg 5 lbs (slot 3 from back) 2 x 10 with 1 min flexion steady stretch after each rep.    Theract: (to improve stairs, transfers, squatting) Leg press double leg 75 lbs x 15 with slow lowering in available flexion range, single leg Lt 25 lbs 2 x 15   Manual: Flexion with IR and distraction with overpressure with contract relax for flexion gains.   Vaso: to Lt knee, medium pressure, 34deg, BLE elevated   TREATMENT                                                                          DATE:  04/20/2023 Therex: Scifit seat 14, level 3, LE only for ROM Leg press press BUE 75# 2x10; vc for quad set and increased ROM Leg press LLE 25# 2x10; vc for quad set and reaching increased flexion ROM   Theract: Anterior step ups 8in 2x10 Lateral step ups 8in 2x10 Seated knee flexion Active assist  2x10  Gait: Patient ambulated 75ft with cane with vc for increased heel contact and terminal knee extension throughout then ambulated  71ft without cane demonstrating good carryover. Continues to lack flexion  Manual: Flexion with IR and distraction with overpressure with contract relax Scar mobilization over healed incision points; 2 steri strips remain intact Percussive device to Lt quad while patient performed active assist knee flexion  Vaso: to Lt knee, medium pressure, 34deg, BLE elevated   TREATMENT                                                                          DATE:  04/17/23 Scifit bike x10 minutes full rotations seat 14x4 minutes full rotations for w/u and then progressed to seat 13 Shuttle BLE press 75# 12 focus on eccentric control and holds into flexion stretch Shuttle left LE press 25# x10  focus on eccentric control and holds into flexion   Knee flexion stretch 15x5 second holds  HS stretches on belt of TM L LE 2x30 seconds  Quad/hip flexor stretches 3x30 seconds supine    Percussion gun to L quad with LEs in elevation Knee flexion OP to tolerance seated edge of mat table    PATIENT EDUCATION:  04/01/2023 Education details: HEP, POC Person educated: Patient Education method: Explanation, Demonstration, Verbal cues, and Handouts Education  comprehension: verbalized understanding, returned demonstration, and verbal cues required  HOME EXERCISE PROGRAM: Access Code: [email or web address] URL: [email or web address] Date: 04/01/2023 Prepared by: Chyrel Masson   Exercises - Supine Heel Slide with Strap  - 3-5 x daily - 7 x weekly - 1 sets - 10 reps - 5-10 hold - Seated Knee Flexion AAROM (Mirrored)  - 3-5 x daily - 7 x weekly - 1 sets - 5 reps - 10-15 hold - Quad Setting and Stretching  - 3-5 x daily - 7 x weekly - 1 sets - 10 reps - 5 hold - Seated Quad Set (Mirrored)  - 3-5 x daily - 7 x weekly - 1 sets - 10 reps - 5 hold - Seated Long Arc Quad (Mirrored)  - 3-5 x daily - 7 x weekly - 1 sets - 5-10 reps - 2 hold - Long Sitting Calf Stretch with Strap  - 2-3 x daily - 7 x weekly -  1 sets - 3-5 reps - 15-30 hold - Seated Gastroc Stretch with Strap (Mirrored)  - 2-3 x daily - 7 x weekly - 1 sets - 5 reps - 30 hold  ASSESSMENT:  CLINICAL IMPRESSION: The patient has attended 10 visits over the course of treatment cycle.  Patient has reported overall improvement at 70%.  See objective data above for updated information regarding current presentation.  Flexion mobility has been the biggest impairment that impacts functional activity. Slow but steady progress has been noted with most recent 95 AROM measurement the highest to this point.  Continued skilled PT services may be beneficial for gains towards goals.  Manipulation option have merit to discuss with physician due to overall amount of motion as well.    OBJECTIVE IMPAIRMENTS: Abnormal gait, decreased activity tolerance, decreased balance, decreased coordination, decreased endurance, decreased knowledge of use of DME, decreased mobility, difficulty walking, decreased ROM, decreased strength, hypomobility, increased edema, impaired perceived functional ability, impaired flexibility, and pain.   ACTIVITY LIMITATIONS: carrying, lifting, bending, sitting, standing, squatting, stairs, and locomotion level  PARTICIPATION LIMITATIONS: meal prep, cleaning, laundry, personal finances, driving, shopping, community activity, occupation, and yard work  PERSONAL FACTORS: Time since onset of injury/illness/exacerbation and 1-2 comorbidities: see above  are also affecting patient's functional outcome.   REHAB POTENTIAL: Good  CLINICAL DECISION MAKING: Stable/uncomplicated  EVALUATION COMPLEXITY: Low   GOALS: Goals reviewed with patient? Yes  SHORT TERM GOALS: (target date for Short term goals are 3 weeks 04/22/2023)   1.  Patient will demonstrate independent use of home exercise program to maintain progress from in clinic treatments.  Goal status: ongoing 04/08/2023  LONG TERM GOALS: (target dates for all long term goals are 10  weeks  06/10/2023 )   1. Patient will demonstrate/report pain at worst less than or equal to 2/10 to facilitate minimal limitation in daily activity secondary to pain symptoms.  Goal status: ongoing 04/08/2023   2. Patient will demonstrate independent use of home exercise program to facilitate ability to maintain/progress functional gains from skilled physical therapy services.  Goal status: ongoing 04/08/2023   3. Patient will demonstrate Patient specific functional scale avg > or = 8 to indicate reduced disability due to condition.   Goal status: ongoing 04/08/2023   4.  Patient will demonstrate Lt LE MMT 5/5 throughout to faciltiate usual transfers, stairs, squatting at [facility] for daily life.   Goal status: ongoing 04/08/2023   5.  Patient will demonstrate up and down a flight of stairs with single hand rail with reciprocal gait  pattern Goal status: ongoing 04/08/2023   6.  Patient will demonstrate independent ambulation > 500 ft s deviation due to condition.   Goal status: ongoing 04/08/2023   7.  Patient will demonstrate AROM 0-115deg to facilitate usual mobility in daily activity at PLOF.  Goal Status: ongoing 04/08/2023   PLAN:  PT FREQUENCY: 2-3x/week (starting at 3x)  PT DURATION: 10 weeks  PLANNED INTERVENTIONS: Can include 09811- PT Re-evaluation, 97110-Therapeutic exercises, 97530- Therapeutic activity, 97112- Neuromuscular re-education, 97535- Self Care, 97140- Manual therapy, [phone]- Gait training, [phone]- Orthotic Fit/training, [phone]- Canalith repositioning, [id number]- Aquatic Therapy, [phone]- Electrical stimulation (unattended), [phone]- Electrical stimulation (manual), [id number] Physical performance testing, 97016- Vasopneumatic device, [id number]- Ultrasound, [id number]- Traction (mechanical), [id number]- Ionotophoresis 4mg /ml Dexamethasone, Patient/Family education, Balance training, Stair training, Taping, Dry Needling, Joint mobilization, Joint manipulation, Spinal manipulation, Spinal mobilization, Scar  mobilization, Vestibular training, Visual/preceptual remediation/compensation, DME instructions, Cryotherapy, and Moist heat.  All performed as medically necessary.  All included unless contraindicated  PLAN FOR NEXT SESSION: Follow up on MD visit.    Chyrel Masson, PT, DPT, OCS, ATC 04/23/23  3:10 PM

## 2023-04-27 ENCOUNTER — Ambulatory Visit (INDEPENDENT_AMBULATORY_CARE_PROVIDER_SITE_OTHER): Payer: BC Managed Care – PPO | Admitting: Rehabilitative and Restorative Service Providers"

## 2023-04-27 ENCOUNTER — Encounter: Payer: Self-pay | Admitting: Rehabilitative and Restorative Service Providers"

## 2023-04-27 ENCOUNTER — Ambulatory Visit (INDEPENDENT_AMBULATORY_CARE_PROVIDER_SITE_OTHER): Payer: BC Managed Care – PPO | Admitting: Orthopaedic Surgery

## 2023-04-27 ENCOUNTER — Encounter: Payer: Self-pay | Admitting: Orthopaedic Surgery

## 2023-04-27 DIAGNOSIS — R2689 Other abnormalities of gait and mobility: Secondary | ICD-10-CM

## 2023-04-27 DIAGNOSIS — M6281 Muscle weakness (generalized): Secondary | ICD-10-CM

## 2023-04-27 DIAGNOSIS — M25562 Pain in left knee: Secondary | ICD-10-CM

## 2023-04-27 DIAGNOSIS — R600 Localized edema: Secondary | ICD-10-CM

## 2023-04-27 DIAGNOSIS — G8929 Other chronic pain: Secondary | ICD-10-CM

## 2023-04-27 DIAGNOSIS — M25662 Stiffness of left knee, not elsewhere classified: Secondary | ICD-10-CM | POA: Diagnosis not present

## 2023-04-27 DIAGNOSIS — Z96652 Presence of left artificial knee joint: Secondary | ICD-10-CM

## 2023-04-27 NOTE — Progress Notes (Signed)
 The patient is now just past 6 weeks status post a left total knee replacement.  He is 63 years old.  He denies any significant pain when he is outside of therapy.  He does have a therapy appointment this afternoon.  He says the best they have been able to flex him most about 95 degrees.  On my exam today he lacks full extension by about 3 degrees and I could only flex him to about 90 degrees today.  He is highly motivated.  I talked about the potential for manipulation under anesthesia.  I decided to go ahead and place a steroid injection in his left knee today to see if this would help with decreasing some of the scar tissue so he can improve his motion.  I would like to see him back in just 2 weeks.  If he is not flexing past 90 to 95 degrees, I would recommend a manipulation under anesthesia.  We will see what he looks like in 2 weeks.

## 2023-04-27 NOTE — Therapy (Signed)
 OUTPATIENT PHYSICAL THERAPY TREATMENT    Patient Name: Gregory Howard MRN: [id number] DOB:20-Dec-1960, 63 y.o., male Today's Date: 04/27/2023  END OF SESSION:  PT End of Session - 04/27/23 1344     Visit Number 11    Number of Visits 25    Date for PT Re-Evaluation 06/11/23    Authorization Type [employer] 20% coinsurance    Progress Note Due on Visit 20    PT Start Time 1328    PT Stop Time 1432    PT Time Calculation (min) 64 min    Activity Tolerance Patient tolerated treatment well    Behavior During Therapy WFL for tasks assessed/performed                Past Medical History:  Diagnosis Date   Arthritis    History of kidney stones    Hypertension    Kidney stones    Pre-diabetes    Past Surgical History:  Procedure Laterality Date   EXTRACORPOREAL SHOCK WAVE LITHOTRIPSY Right 03/23/2017   Procedure: RIGHT EXTRACORPOREAL SHOCK WAVE LITHOTRIPSY (ESWL);  Surgeon: Marcine Matar, MD;  Location: [facility];  Service: Urology;  Laterality: Right;   HERNIA REPAIR     done as child   LITHOTRIPSY     TOTAL HIP ARTHROPLASTY Left 08/24/2020   Procedure: LEFT TOTAL HIP ARTHROPLASTY ANTERIOR APPROACH;  Surgeon: [name], MD;  Location: [facility];  Service: Orthopedics;  Laterality: Left;   TOTAL KNEE ARTHROPLASTY Left 03/13/2023   Procedure: LEFT TOTAL KNEE ARTHROPLASTY;  Surgeon: [name], MD;  Location: [facility];  Service: Orthopedics;  Laterality: Left;   Patient Active Problem List   Diagnosis Date Noted   Status post total left knee replacement 03/13/2023   Status post total replacement of left hip 08/24/2020    PCP: Daisy Floro, MD  REFERRING PROVIDER: [name]  REFERRING DIAG:  N82.956 Status post total left knee replacement - Primary    THERAPY DIAG:  Chronic pain of left knee  Muscle weakness (generalized)  Stiffness of left knee, not elsewhere classified  Other abnormalities of gait and  mobility  Localized edema  Rationale for Evaluation and Treatment: Rehabilitation  ONSET DATE: 03/13/2023 Lt TKA  SUBJECTIVE:   SUBJECTIVE STATEMENT: Saw MD with injection performed.  Plan to return to MD in 2 weeks for follow up on motion gains.  Bending complaints chief complaint.   PERTINENT HISTORY: Lt THA (2022), HTN, arthritis   PAIN:  NPRS scale: mild at rest Pain location: Lt knee Pain description: sharp when at end range  Aggravating factors: bending the knee Relieving factors: icing, meds, rest  PRECAUTIONS: None  WEIGHT BEARING RESTRICTIONS: No  FALLS:  Has patient fallen in last 6 months? No  LIVING ENVIRONMENT: Lives with: lives with their family and lives with their spouse Lives in: House/apartment Stairs: Yes: External: 3 steps; none Has following equipment at home: Single point cane, Walker - 2 wheeled, Marine scientist  OCCUPATION: short term disability; does Biomedical engineer. Does need to be able to squat/bend down/ stand for long periods of time and will be wearing heavy steel toe boots. Needs to go back to work 12 wks from now (around 1st or 2nd week of May)  PLOF: Independent  PATIENT GOALS: "get back to normal and get back to work"   Next MD visit: 04/27/2023 Dr. Magnus Ivan   OBJECTIVE:   DIAGNOSTIC FINDINGS:  FINDINGS: Interval total left knee arthroplasty. No perihardware lucency is seen to  indicate hardware failure or loosening. Expected postoperative changes including intra-articular and subcutaneous air. Smalljoint effusion. Anterior surgical skin staples. Mild chronic enthesopathic change at the quadriceps insertion on the patella. No acute fracture or dislocation.  PATIENT SURVEYS:   Patient-specific activity scoring scheme   "0" represents "unable to perform." "10" represents "able to perform at prior level. 0 1 2 3 4 5 6 7 8 9  10 (Date and Score) Activity Initial  Activity Eval 04/01/2023 04/23/2023   Bending/straightening knee  for don/doff shoes 0    2. Stairs in reciprocal pattern 0    3. Standing for long periods of time 3   4.    5.     Total score = sum of the activity scores/number of activities Minimum detectable change (90%CI) for average score = 2 points Minimum detectable change (90%CI) for single activity score = 3 points  Score: 1/10   COGNITION: 04/01/2023 Overall cognitive status: WFL    SENSATION: 04/01/2023 [facility]  EDEMA:  04/01/2023 Circumferential: above knee: 45cm; below knee: 37.5cm  MUSCLE LENGTH: 04/01/2023 Not tested  POSTURE:  04/01/2023 No Significant postural limitations  PALPATION: 04/01/2023 Non-tender to calf/quad/hamstrings. Notable HS tightness.  LOWER EXTREMITY ROM:   ROM Left 04/01/2023 Left 04/02/2023 Left 04/06/23 Left 04/08/2023 Left 04/14/2023 Left 04/15/2023 Left 04/23/2023 Left 04/27/2023  Hip flexion          Hip extension          Hip abduction          Hip adduction          Hip internal rotation          Hip external rotation          Knee flexion A:72 P: not attempted due to patient pain and tightness Supine with strap AAROM:78 Supine A: 85 P: 90 Supine:  AAROM 85 Supine AROM heel slide 93  Supine AAROM with strap 90 Supine AROM heel slide 95 Supine AAROM with strap 100 deg.   Knee extension A: 4 P: 0 A:2  A: 2 P: 0       Ankle dorsiflexion          Ankle plantarflexion          Ankle inversion          Ankle eversion           (Blank rows = not tested)  LOWER EXTREMITY MMT:  MMT Right 04/01/2023 Left 04/01/2023 Left 04/14/2023  Hip flexion 5 5   Hip extension     Hip abduction     Hip adduction     Hip internal rotation     Hip external rotation     Knee flexion 5 3-   Knee extension 5 3- 5/5 (in available range)  Ankle dorsiflexion 5 5   Ankle plantarflexion     Ankle inversion     Ankle eversion      (Blank rows = not tested)  LOWER EXTREMITY SPECIAL TESTS:  04/01/2023 None tested  FUNCTIONAL TESTS:  04/01/2023 Not  tested   GAIT: 04/20/2023: Able to perform household distances in clinic without [facility] but SPC use recommended in community still to offload LLE  04/01/2023 Distance walked: clinic distance Assistive device utilized: Single point cane Level of assistance: Modified independence Comments: decreased knee extension, decreased stance time on LLE, decreased step length, foot flat due to decreased ability to extend knee- patient able to DF appropriately as seen in MMT  TREATMENT                                                                          DATE:  04/27/2023 Therex: UBE LE only for ROM, seat 12 10.5 mins lvl 3.0 Incline gastroc stretch 30 sec x 3  Supine AAROM heel slide c strap 15 sec x 6  Knee extension machine double leg up single leg down Lt leg 5 lbs (slot 3 from back) 3 x 10 with 1 min flexion steady stretch after each rep.    Theract: (to improve stairs, transfers, squatting) Leg press double leg 87 lbs 2 x 15 with slow lowering in available flexion range, single leg Lt 31 lbs 2 x 15 - pauses into flexion for mobility gain.  Step forward WB 2 x 15 6 inch step with light hand assist   Manual: Flexion with IR and distraction with overpressure with contract relax for flexion gains.  Percussive device to Lt quad during.   Vaso: to Lt knee, medium pressure, 34deg, BLE elevated   TREATMENT                                                                          DATE:  04/23/2023 Therex: UBE LE only for ROM, seat 11 10 mins lvl 3.0 Supine AROM heel slide 5 sec hold x 10  Knee extension machine double leg up single leg down Lt leg 5 lbs (slot 3 from back) 2 x 10 with 1 min flexion steady stretch after each rep.    Theract: (to improve stairs, transfers, squatting) Leg press double leg 75 lbs x 15 with slow  lowering in available flexion range, single leg Lt 25 lbs 2 x 15   Manual: Flexion with IR and distraction with overpressure with contract relax for flexion gains.   Vaso: to Lt knee, medium pressure, 34deg, BLE elevated   TREATMENT                                                                          DATE:  04/20/2023 Therex: Scifit seat 14, level 3, LE only for ROM Leg press press BUE 75# 2x10; vc for quad set and increased ROM Leg press LLE 25# 2x10; vc for quad set and reaching increased flexion ROM   Theract: Anterior step ups 8in 2x10 Lateral step ups 8in 2x10 Seated knee flexion Active assist  2x10  Gait: Patient ambulated 75ft with cane with vc for increased heel contact and terminal knee extension throughout then ambulated 79ft without cane demonstrating good carryover. Continues to lack flexion  Manual: Flexion with IR and distraction with overpressure with contract relax Scar mobilization over healed  incision points; 2 steri strips remain intact Percussive device to Lt quad while patient performed active assist knee flexion  Vaso: to Lt knee, medium pressure, 34deg, BLE elevated     PATIENT EDUCATION:  04/01/2023 Education details: HEP, POC Person educated: Patient Education method: Programmer, multimedia, Demonstration, Verbal cues, and Handouts Education comprehension: verbalized understanding, returned demonstration, and verbal cues required  HOME EXERCISE PROGRAM: Access Code: [email or web address] URL: [email or web address] Date: 04/01/2023 Prepared by: Chyrel Masson   Exercises - Supine Heel Slide with Strap  - 3-5 x daily - 7 x weekly - 1 sets - 10 reps - 5-10 hold - Seated Knee Flexion AAROM (Mirrored)  - 3-5 x daily - 7 x weekly - 1 sets - 5 reps - 10-15 hold - Quad Setting and Stretching  - 3-5 x daily - 7 x weekly - 1 sets - 10 reps - 5 hold - Seated Quad Set (Mirrored)  - 3-5 x daily - 7 x weekly - 1 sets - 10 reps - 5 hold - Seated  Long Arc Quad (Mirrored)  - 3-5 x daily - 7 x weekly - 1 sets - 5-10 reps - 2 hold - Long Sitting Calf Stretch with Strap  - 2-3 x daily - 7 x weekly - 1 sets - 3-5 reps - 15-30 hold - Seated Gastroc Stretch with Strap (Mirrored)  - 2-3 x daily - 7 x weekly - 1 sets - 5 reps - 30 hold  ASSESSMENT:  CLINICAL IMPRESSION: Flexion quality to reach end range showed some improvement today.  Continued focus on mobility gains mixed in with strengthening program.  Big goal of gains in next 2 weeks prior to MD return.    OBJECTIVE IMPAIRMENTS: Abnormal gait, decreased activity tolerance, decreased balance, decreased coordination, decreased endurance, decreased knowledge of use of DME, decreased mobility, difficulty walking, decreased ROM, decreased strength, hypomobility, increased edema, impaired perceived functional ability, impaired flexibility, and pain.   ACTIVITY LIMITATIONS: carrying, lifting, bending, sitting, standing, squatting, stairs, and locomotion level  PARTICIPATION LIMITATIONS: meal prep, cleaning, laundry, personal finances, driving, shopping, community activity, occupation, and yard work  PERSONAL FACTORS: Time since onset of injury/illness/exacerbation and 1-2 comorbidities: see above  are also affecting patient's functional outcome.   REHAB POTENTIAL: Good  CLINICAL DECISION MAKING: Stable/uncomplicated  EVALUATION COMPLEXITY: Low   GOALS: Goals reviewed with patient? Yes  SHORT TERM GOALS: (target date for Short term goals are 3 weeks 04/22/2023)   1.  Patient will demonstrate independent use of home exercise program to maintain progress from in clinic treatments.  Goal status: ongoing 04/08/2023  LONG TERM GOALS: (target dates for all long term goals are 10 weeks  06/10/2023 )   1. Patient will demonstrate/report pain at worst less than or equal to 2/10 to facilitate minimal limitation in daily activity secondary to pain symptoms.  Goal status: ongoing 04/08/2023   2.  Patient will demonstrate independent use of home exercise program to facilitate ability to maintain/progress functional gains from skilled physical therapy services.  Goal status: ongoing 04/08/2023   3. Patient will demonstrate Patient specific functional scale avg > or = 8 to indicate reduced disability due to condition.   Goal status: ongoing 04/08/2023   4.  Patient will demonstrate Lt LE MMT 5/5 throughout to faciltiate usual transfers, stairs, squatting at [facility] for daily life.   Goal status: ongoing 04/08/2023   5.  Patient will demonstrate up and down a flight of stairs with single hand rail with reciprocal gait pattern Goal  status: ongoing 04/08/2023   6.  Patient will demonstrate independent ambulation > 500 ft s deviation due to condition.   Goal status: ongoing 04/08/2023   7.  Patient will demonstrate AROM 0-115deg to facilitate usual mobility in daily activity at PLOF.  Goal Status: ongoing 04/08/2023   PLAN:  PT FREQUENCY: 2-3x/week (starting at 3x)  PT DURATION: 10 weeks  PLANNED INTERVENTIONS: Can include 46962- PT Re-evaluation, 97110-Therapeutic exercises, 97530- Therapeutic activity, 97112- Neuromuscular re-education, 97535- Self Care, 97140- Manual therapy, [phone]- Gait training, [phone]- Orthotic Fit/training, [phone]- Canalith repositioning, [id number]- Aquatic Therapy, [phone]- Electrical stimulation (unattended), [phone]- Electrical stimulation (manual), [id number] Physical performance testing, 97016- Vasopneumatic device, [id number]- Ultrasound, [id number]- Traction (mechanical), [id number]- Ionotophoresis 4mg /ml Dexamethasone, Patient/Family education, Balance training, Stair training, Taping, Dry Needling, Joint mobilization, Joint manipulation, Spinal manipulation, Spinal mobilization, Scar mobilization, Vestibular training, Visual/preceptual remediation/compensation, DME instructions, Cryotherapy, and Moist heat.  All performed as medically necessary.  All included unless contraindicated  PLAN FOR NEXT  SESSION: Flexion gains, strengthening as able.    Chyrel Masson, PT, DPT, OCS, ATC 04/27/23  2:29 PM

## 2023-04-30 ENCOUNTER — Ambulatory Visit (INDEPENDENT_AMBULATORY_CARE_PROVIDER_SITE_OTHER): Payer: BC Managed Care – PPO | Admitting: Rehabilitative and Restorative Service Providers"

## 2023-04-30 ENCOUNTER — Encounter: Payer: Self-pay | Admitting: Rehabilitative and Restorative Service Providers"

## 2023-04-30 DIAGNOSIS — R600 Localized edema: Secondary | ICD-10-CM

## 2023-04-30 DIAGNOSIS — M25662 Stiffness of left knee, not elsewhere classified: Secondary | ICD-10-CM

## 2023-04-30 DIAGNOSIS — G8929 Other chronic pain: Secondary | ICD-10-CM

## 2023-04-30 DIAGNOSIS — M6281 Muscle weakness (generalized): Secondary | ICD-10-CM

## 2023-04-30 DIAGNOSIS — R2689 Other abnormalities of gait and mobility: Secondary | ICD-10-CM

## 2023-04-30 DIAGNOSIS — M25562 Pain in left knee: Secondary | ICD-10-CM

## 2023-04-30 NOTE — Therapy (Signed)
 OUTPATIENT PHYSICAL THERAPY TREATMENT    Patient Name: Gregory Howard MRN: [id number] DOB:[birth year], 63 y.o., male Today's Date: 04/30/2023  END OF SESSION:  PT End of Session - 04/30/23 1330     Visit Number 12    Number of Visits 25    Date for PT Re-Evaluation 06/11/23    Authorization Type [employer] 20% coinsurance    Progress Note Due on Visit 20    PT Start Time 1330    PT Stop Time 1422    PT Time Calculation (min) 52 min    Activity Tolerance Patient tolerated treatment well    Behavior During Therapy WFL for tasks assessed/performed                Past Medical History:  Diagnosis Date   Arthritis    History of kidney stones    Hypertension    Kidney stones    Pre-diabetes    Past Surgical History:  Procedure Laterality Date   EXTRACORPOREAL SHOCK WAVE LITHOTRIPSY Right 03/23/2017   Procedure: RIGHT EXTRACORPOREAL SHOCK WAVE LITHOTRIPSY (ESWL);  Surgeon: Marcine Matar, MD;  Location: [facility];  Service: Urology;  Laterality: Right;   HERNIA REPAIR     done as child   LITHOTRIPSY     TOTAL HIP ARTHROPLASTY Left 08/24/2020   Procedure: LEFT TOTAL HIP ARTHROPLASTY ANTERIOR APPROACH;  Surgeon: [name], MD;  Location: [facility];  Service: Orthopedics;  Laterality: Left;   TOTAL KNEE ARTHROPLASTY Left 03/13/2023   Procedure: LEFT TOTAL KNEE ARTHROPLASTY;  Surgeon: [name], MD;  Location: [facility];  Service: Orthopedics;  Laterality: Left;   Patient Active Problem List   Diagnosis Date Noted   Status post total left knee replacement 03/13/2023   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 Is not wanting a manipulation so will be increasing his efforts as much as possible.  PERTINENT HISTORY: Lt THA (2022), HTN, arthritis   PAIN:  NPRS scale: mild at rest Pain location: Lt knee Pain description: sharp when at end range  Aggravating factors: bending the knee Relieving factors: icing, meds, rest  PRECAUTIONS: None  WEIGHT BEARING RESTRICTIONS: No  FALLS:  Has patient fallen in last 6 months? No  LIVING ENVIRONMENT: Lives with: lives with their family and lives with their spouse Lives in: House/apartment Stairs: Yes: External: 3 steps; none Has following equipment at home: Single point cane, Walker - 2 wheeled, Marine scientist  OCCUPATION: short term disability; does Biomedical engineer. Does need to be able to squat/bend down/ stand for long periods of time and will be wearing heavy steel toe boots. Needs to go back to work 12 wks from now (around 1st or 2nd week of May)  PLOF: Independent  PATIENT GOALS: "get back to normal and get back to work"   Next MD visit: 04/27/2023 Dr. Magnus Ivan   OBJECTIVE:  DIAGNOSTIC FINDINGS:  FINDINGS: Interval total left knee arthroplasty. No perihardware lucency is seen to indicate hardware failure or loosening. Expected postoperative changes including intra-articular and subcutaneous air. Smalljoint effusion. Anterior surgical skin staples. Mild chronic enthesopathic change at the quadriceps insertion on the patella. No acute fracture or dislocation.  PATIENT SURVEYS:   Patient-specific activity scoring scheme   "0" represents "unable to perform." "10" represents "able to perform at prior level. 0 1 2 3 4 5 6 7 8 9  10 (Date and  Score) Activity Initial  Activity Eval 04/01/2023  Bending/straightening knee for don/doff shoes 0  2. Stairs in reciprocal pattern 0  3. Standing for long periods of time 3  4.   5.    Total score = sum of the activity scores/number of activities Minimum detectable change (90%CI) for average score = 2 points Minimum detectable change (90%CI) for single activity score = 3 points  Score: 1/10   COGNITION: 04/01/2023 Overall cognitive status: WFL    SENSATION: 04/01/2023 [facility]  EDEMA:  04/01/2023 Circumferential: above knee: 45cm; below knee: 37.5cm  MUSCLE LENGTH: 04/01/2023 Not tested  POSTURE:  04/01/2023 No Significant postural limitations  PALPATION: 04/01/2023 Non-tender to calf/quad/hamstrings. Notable HS tightness.  LOWER EXTREMITY ROM:   ROM Left 04/01/2023 Left 04/02/2023 Left 04/06/23 Left 04/08/2023 Left 04/14/2023 Left 04/15/2023 Left 04/23/2023 Left 04/27/2023  Hip flexion          Hip extension          Hip abduction          Hip adduction          Hip internal rotation          Hip external rotation          Knee flexion A:72 P: not attempted due to patient pain and tightness Supine with strap AAROM:78 Supine A: 85 P: 90 Supine:  AAROM 85 Supine AROM heel slide 93  Supine AAROM with strap 90 Supine AROM heel slide 95 Supine AAROM with strap 100 deg.   Knee extension A: 4 P: 0 A:2  A: 2 P: 0       Ankle dorsiflexion          Ankle plantarflexion          Ankle inversion          Ankle eversion           (Blank rows = not tested)  LOWER EXTREMITY MMT:  MMT Right 04/01/2023 Left 04/01/2023 Left 04/14/2023  Hip flexion 5 5   Hip extension     Hip abduction     Hip adduction     Hip internal rotation     Hip external rotation     Knee flexion 5 3-   Knee extension 5 3- 5/5 (in available range)  Ankle dorsiflexion 5 5   Ankle plantarflexion     Ankle inversion     Ankle eversion      (Blank rows = not tested)  LOWER EXTREMITY SPECIAL  TESTS:  04/01/2023 None tested  FUNCTIONAL TESTS:  04/01/2023 Not tested   GAIT: 04/30/2023: ambulates with SPC at times just carrying SPC in hand. Good balance noted however flexion remains decreased. Encouraged to continue use with SPC until better gait mechanics and overall strength are achieved.  04/20/2023: Able to perform household distances in clinic without [facility] but SPC use recommended in community still to offload LLE  04/01/2023 Distance walked: clinic distance Assistive device utilized: Single point cane Level  of assistance: Modified independence Comments: decreased knee extension, decreased stance time on LLE, decreased step length, foot flat due to decreased ability to extend knee- patient able to DF appropriately as seen in MMT                                                                                                                                                                        TREATMENT                                                                          DATE:  04/30/2023 Therex: UBE LE only for ROM, seat 12 10 mins lvl 3.0 Knee extension machine double leg up single leg down Lt leg 5 lbs (slot 3 from back) 3 x 10 with 1 min flexion steady stretch after each rep.  Theract: (to improve stairs, transfers, squatting) Leg press double leg 87 lbs 2 x 15 with slow lowering in available flexion range, single leg Lt 31 lbs 3 x 15 - pauses into flexion for mobility gain.  12 steps Lt railing descending with step to pattern alternating lowering leg, LLE leading on ascent  Manual: Flexion with IR and distraction with overpressure with contract relax for flexion gains.  Percussive device to Lt quad.   Vaso: to Lt knee, medium pressure, 34deg, BLE elevated   TREATMENT                                                                          DATE:  04/27/2023 Therex: UBE LE only for ROM, seat 12 10.5 mins lvl 3.0 Incline gastroc stretch 30 sec x 3  Supine AAROM  heel slide c strap 15 sec x 6  Knee extension machine double leg up single leg down Lt leg 5 lbs (slot 3 from back) 3 x 10 with 1 min flexion steady stretch after each rep.    Theract: (to improve stairs, transfers, squatting) Leg press double leg 87 lbs 2 x 15 with slow lowering in available flexion range, single leg Lt 31 lbs 2 x 15 - pauses into flexion for mobility gain.  Step forward WB 2 x 15 6 inch step with light hand  assist   Manual: Flexion with IR and distraction with overpressure with contract relax for flexion gains.  Percussive device to Lt quad during.   Vaso: to Lt knee, medium pressure, 34deg, BLE elevated   TREATMENT                                                                          DATE:  04/23/2023 Therex: UBE LE only for ROM, seat 11 10 mins lvl 3.0 Supine AROM heel slide 5 sec hold x 10  Knee extension machine double leg up single leg down Lt leg 5 lbs (slot 3 from back) 2 x 10 with 1 min flexion steady stretch after each rep.    Theract: (to improve stairs, transfers, squatting) Leg press double leg 75 lbs x 15 with slow lowering in available flexion range, single leg Lt 25 lbs 2 x 15   Manual: Flexion with IR and distraction with overpressure with contract relax for flexion gains.   Vaso: to Lt knee, medium pressure, 34deg, BLE elevated   TREATMENT                                                                          DATE:  04/20/2023 Therex: Scifit seat 14, level 3, LE only for ROM Leg press press BUE 75# 2x10; vc for quad set and increased ROM Leg press LLE 25# 2x10; vc for quad set and reaching increased flexion ROM   Theract: Anterior step ups 8in 2x10 Lateral step ups 8in 2x10 Seated knee flexion Active assist  2x10  Gait: Patient ambulated 82ft with cane with vc for increased heel contact and terminal knee extension throughout then ambulated 49ft without cane demonstrating good carryover. Continues to lack  flexion  Manual: Flexion with IR and distraction with overpressure with contract relax Scar mobilization over healed incision points; 2 steri strips remain intact Percussive device to Lt quad while patient performed active assist knee flexion  Vaso: to Lt knee, medium pressure, 34deg, BLE elevated     PATIENT EDUCATION:  04/01/2023 Education details: HEP, POC Person educated: Patient Education method: Programmer, multimedia, Demonstration, Verbal cues, and Handouts Education comprehension: verbalized understanding, returned demonstration, and verbal cues required  HOME EXERCISE PROGRAM: Access Code: [email or web address] URL: [email or web address] Date: 04/01/2023 Prepared by: Chyrel Masson   Exercises - Supine Heel Slide with Strap  - 3-5 x daily - 7 x weekly - 1 sets - 10 reps - 5-10 hold - Seated Knee Flexion AAROM (Mirrored)  - 3-5 x daily - 7 x weekly - 1 sets - 5 reps - 10-15 hold - Quad Setting and Stretching  - 3-5 x daily - 7 x weekly - 1 sets - 10 reps - 5 hold - Seated Quad Set (Mirrored)  - 3-5 x daily - 7 x weekly - 1 sets - 10 reps - 5 hold - Seated Long Arc Quad (Mirrored)  - 3-5 x daily - 7 x weekly -  1 sets - 5-10 reps - 2 hold - Long Sitting Calf Stretch with Strap  - 2-3 x daily - 7 x weekly - 1 sets - 3-5 reps - 15-30 hold - Seated Gastroc Stretch with Strap (Mirrored)  - 2-3 x daily - 7 x weekly - 1 sets - 5 reps - 30 hold  ASSESSMENT:  CLINICAL IMPRESSION: Fluidity of movement was better today as patient was able to achieve greater range during manual without hip compensatory measure. Negotiated stairs with 1 UE in step to pattern however was able to actively use LLE during ascent and descent.  Will need larger improvements in ROM to avoid manipulation. Will benefit from continued skilled physical therapy.    OBJECTIVE IMPAIRMENTS: Abnormal gait, decreased activity tolerance, decreased balance, decreased coordination, decreased endurance, decreased knowledge  of use of DME, decreased mobility, difficulty walking, decreased ROM, decreased strength, hypomobility, increased edema, impaired perceived functional ability, impaired flexibility, and pain.   ACTIVITY LIMITATIONS: carrying, lifting, bending, sitting, standing, squatting, stairs, and locomotion level  PARTICIPATION LIMITATIONS: meal prep, cleaning, laundry, personal finances, driving, shopping, community activity, occupation, and yard work  PERSONAL FACTORS: Time since onset of injury/illness/exacerbation and 1-2 comorbidities: see above  are also affecting patient's functional outcome.   REHAB POTENTIAL: Good  CLINICAL DECISION MAKING: Stable/uncomplicated  EVALUATION COMPLEXITY: Low   GOALS: Goals reviewed with patient? Yes  SHORT TERM GOALS: (target date for Short term goals are 3 weeks 04/22/2023)   1.  Patient will demonstrate independent use of home exercise program to maintain progress from in clinic treatments.  Goal status: ongoing 04/08/2023  LONG TERM GOALS: (target dates for all long term goals are 10 weeks  06/10/2023 )   1. Patient will demonstrate/report pain at worst less than or equal to 2/10 to facilitate minimal limitation in daily activity secondary to pain symptoms.  Goal status: ongoing 04/08/2023   2. Patient will demonstrate independent use of home exercise program to facilitate ability to maintain/progress functional gains from skilled physical therapy services.  Goal status: ongoing 04/08/2023   3. Patient will demonstrate Patient specific functional scale avg > or = 8 to indicate reduced disability due to condition.   Goal status: ongoing 04/08/2023   4.  Patient will demonstrate Lt LE MMT 5/5 throughout to faciltiate usual transfers, stairs, squatting at [facility] for daily life.   Goal status: ongoing 04/08/2023   5.  Patient will demonstrate up and down a flight of stairs with single hand rail with reciprocal gait pattern Goal status: ongoing 04/08/2023   6.   Patient will demonstrate independent ambulation > 500 ft s deviation due to condition.   Goal status: ongoing 04/08/2023   7.  Patient will demonstrate AROM 0-115deg to facilitate usual mobility in daily activity at PLOF.  Goal Status: ongoing 04/08/2023   PLAN:  PT FREQUENCY: 2-3x/week (starting at 3x)  PT DURATION: 10 weeks  PLANNED INTERVENTIONS: Can include 78295- PT Re-evaluation, 97110-Therapeutic exercises, 97530- Therapeutic activity, 97112- Neuromuscular re-education, 97535- Self Care, 97140- Manual therapy, [phone]- Gait training, [phone]- Orthotic Fit/training, [phone]- Canalith repositioning, [id number]- Aquatic Therapy, [phone]- Electrical stimulation (unattended), [phone]- Electrical stimulation (manual), [id number] Physical performance testing, 97016- Vasopneumatic device, [id number]- Ultrasound, [id number]- Traction (mechanical), [id number]- Ionotophoresis 4mg /ml Dexamethasone, Patient/Family education, Balance training, Stair training, Taping, Dry Needling, Joint mobilization, Joint manipulation, Spinal manipulation, Spinal mobilization, Scar mobilization, Vestibular training, Visual/preceptual remediation/compensation, DME instructions, Cryotherapy, and Moist heat.  All performed as medically necessary.  All included unless contraindicated  PLAN FOR NEXT SESSION: Continue  with flexion gains, continue stair negotiation for strengthening of LLE, increase leg press as tolerated.      Lakecia Deschamps, Burlon Centrella, Student-PT 04/30/2023, 4:20 PM

## 2023-05-04 ENCOUNTER — Encounter: Payer: Self-pay | Admitting: Rehabilitative and Restorative Service Providers"

## 2023-05-04 ENCOUNTER — Ambulatory Visit (INDEPENDENT_AMBULATORY_CARE_PROVIDER_SITE_OTHER): Payer: BC Managed Care – PPO | Admitting: Rehabilitative and Restorative Service Providers"

## 2023-05-04 DIAGNOSIS — M6281 Muscle weakness (generalized): Secondary | ICD-10-CM

## 2023-05-04 DIAGNOSIS — M25562 Pain in left knee: Secondary | ICD-10-CM

## 2023-05-04 DIAGNOSIS — M25662 Stiffness of left knee, not elsewhere classified: Secondary | ICD-10-CM | POA: Diagnosis not present

## 2023-05-04 DIAGNOSIS — G8929 Other chronic pain: Secondary | ICD-10-CM

## 2023-05-04 DIAGNOSIS — R600 Localized edema: Secondary | ICD-10-CM

## 2023-05-04 DIAGNOSIS — R2689 Other abnormalities of gait and mobility: Secondary | ICD-10-CM

## 2023-05-04 NOTE — Therapy (Signed)
 OUTPATIENT PHYSICAL THERAPY TREATMENT    Patient Name: Gregory Howard MRN: [id number] DOB:Jul 03, 1960, 63 y.o., male Today's Date: 05/04/2023  END OF SESSION:  PT End of Session - 05/04/23 1340     Visit Number 13    Number of Visits 25    Date for PT Re-Evaluation 06/11/23    Authorization Type [employer] 20% coinsurance    Progress Note Due on Visit 20    PT Start Time 1337    PT Stop Time 1440    PT Time Calculation (min) 63 min    Activity Tolerance Patient tolerated treatment well    Behavior During Therapy WFL for tasks assessed/performed                 Past Medical History:  Diagnosis Date   Arthritis    History of kidney stones    Hypertension    Kidney stones    Pre-diabetes    Past Surgical History:  Procedure Laterality Date   EXTRACORPOREAL SHOCK WAVE LITHOTRIPSY Right 03/23/2017   Procedure: RIGHT EXTRACORPOREAL SHOCK WAVE LITHOTRIPSY (ESWL);  Surgeon: Marcine Matar, MD;  Location: [facility];  Service: Urology;  Laterality: Right;   HERNIA REPAIR     done as child   LITHOTRIPSY     TOTAL HIP ARTHROPLASTY Left 08/24/2020   Procedure: LEFT TOTAL HIP ARTHROPLASTY ANTERIOR APPROACH;  Surgeon: [name], MD;  Location: [facility];  Service: Orthopedics;  Laterality: Left;   TOTAL KNEE ARTHROPLASTY Left 03/13/2023   Procedure: LEFT TOTAL KNEE ARTHROPLASTY;  Surgeon: [name], MD;  Location: [facility];  Service: Orthopedics;  Laterality: Left;   Patient Active Problem List   Diagnosis Date Noted   Status post total left knee replacement 03/13/2023   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 Is not wanting a manipulation so will be increasing his efforts as much as possible.  PERTINENT HISTORY: Lt THA (2022), HTN, arthritis   PAIN:  NPRS scale: mild at rest Pain location: Lt knee Pain description: sharp when at end range  Aggravating factors: bending the knee Relieving factors: icing, meds, rest  PRECAUTIONS: None  WEIGHT BEARING RESTRICTIONS: No  FALLS:  Has patient fallen in last 6 months? No  LIVING ENVIRONMENT: Lives with: lives with their family and lives with their spouse Lives in: House/apartment Stairs: Yes: External: 3 steps; none Has following equipment at home: Single point cane, Walker - 2 wheeled, Marine scientist  OCCUPATION: short term disability; does Biomedical engineer. Does need to be able to squat/bend down/ stand for long periods of time and will be wearing heavy steel toe boots. Needs to go back to work 12 wks from now (around 1st or 2nd week of May)  PLOF: Independent  PATIENT GOALS: "get back to normal and get back to work"   Next MD visit: 04/27/2023 Dr. Magnus Ivan  OBJECTIVE:   DIAGNOSTIC FINDINGS:  FINDINGS: Interval total left knee arthroplasty. No perihardware lucency is seen to indicate hardware failure or loosening. Expected postoperative changes including intra-articular and subcutaneous air. Smalljoint effusion. Anterior surgical skin staples. Mild chronic enthesopathic change at the quadriceps insertion on the patella. No acute fracture or dislocation.  PATIENT SURVEYS:   Patient-specific activity scoring scheme   "0" represents "unable to perform." "10" represents "able to perform at prior level. 0 1 2 3 4 5 6 7 8 9  10 (Date and  Score) Activity Initial  Activity Eval 04/01/2023 05/04/2023  Bending/straightening knee for don/doff shoes 0   2. Stairs in reciprocal pattern 0   3. Standing for long periods of time 3   4.    5.     Total score = sum of the activity scores/number of activities Minimum detectable change (90%CI) for average score = 2 points Minimum detectable change (90%CI) for single activity score = 3 points  Score: 1/10   COGNITION: 04/01/2023 Overall cognitive status: WFL    SENSATION: 04/01/2023 [facility]  EDEMA:  04/01/2023 Circumferential: above knee: 45cm; below knee: 37.5cm  MUSCLE LENGTH: 04/01/2023 Not tested  POSTURE:  04/01/2023 No Significant postural limitations  PALPATION: 04/01/2023 Non-tender to calf/quad/hamstrings. Notable HS tightness.  LOWER EXTREMITY ROM:   ROM Left 04/01/2023 Left 04/02/2023 Left 04/06/23 Left 04/08/2023 Left 04/14/2023 Left 04/15/2023 Left 04/23/2023 Left 04/27/2023 Left 05/04/2023  Hip flexion           Hip extension           Hip abduction           Hip adduction           Hip internal rotation           Hip external rotation           Knee flexion A:72 P: not attempted due to patient pain and tightness Supine with strap AAROM:78 Supine A: 85 P: 90 Supine:  AAROM 85 Supine AROM heel slide 93  Supine AAROM with strap 90 Supine AROM heel slide 95 Supine AAROM with strap 100 deg.  Supine AROM heel slide: 104  Knee extension A: 4 P: 0 A:2  A: 2 P: 0        Ankle dorsiflexion           Ankle plantarflexion           Ankle inversion           Ankle eversion            (Blank rows = not tested)  LOWER EXTREMITY MMT:  MMT Right 04/01/2023 Left 04/01/2023 Left 04/14/2023  Hip flexion 5 5   Hip extension     Hip abduction     Hip adduction     Hip internal rotation     Hip external rotation     Knee flexion 5 3-   Knee extension 5 3- 5/5 (in available range)  Ankle dorsiflexion 5 5   Ankle plantarflexion     Ankle inversion      Ankle eversion      (Blank rows = not tested)  LOWER EXTREMITY SPECIAL TESTS:  04/01/2023 None tested  FUNCTIONAL TESTS:  04/01/2023 Not tested   GAIT: 04/30/2023:  ambulates with SPC at times just carrying SPC in hand. Good balance noted however flexion remains decreased. Encouraged to continue use with SPC until better gait mechanics and overall strength are achieved.  04/20/2023: Able to perform household  distances in clinic without SPC but SPC use recommended in community still to offload LLE  04/01/2023 Distance walked: clinic distance Assistive device utilized: Single point cane Level of assistance: Modified independence Comments: decreased knee extension, decreased stance time on LLE, decreased step length, foot flat due to decreased ability to extend knee- patient able to DF appropriately as seen in MMT                                                                                                                                                                         TREATMENT                                                                          DATE:  05/04/2023 Therex: UBE LE only for ROM, seat 11 10 mins lvl 3.0 Incline gastroc runner stretch Lt leg posterior 30 sec x 5  Knee extension machine double leg up single leg down Lt leg 15 lbs 2 x 15 Long duration stretch on knee machine slot 2 from back 5 lbs 3 mins   Theract: (to improve stairs, transfers, squatting) Leg press double leg 87 lbs 2 x 15 with slow lowering in available flexion range, single leg Lt 37 lbs 3 x 15 - pauses into flexion for mobility gain.  Step on over and down WB on Lt leg 6 inch step with single hand assist x 16  Manual: Flexion with IR and distraction with overpressure with contract relax for flexion gains.  Percussive device to Lt quad.   Vaso: to Lt knee, medium pressure, 34deg, BLE elevated   TREATMENT                                                                          DATE:   04/30/2023 Therex: UBE LE only for ROM, seat 12 10 mins lvl 3.0 Knee extension machine double leg up single leg down Lt leg 5 lbs (slot 3 from back) 3 x 10 with 1 min flexion steady stretch after each rep.  Theract: (to improve stairs, transfers, squatting) Leg press double leg 87 lbs 2 x 15 with slow lowering in available flexion range, single leg Lt 31  lbs 3 x 15 - pauses into flexion for mobility gain.  12 steps Lt railing descending with step to pattern alternating lowering leg, LLE leading on ascent  Manual: Flexion with IR and distraction with overpressure with contract relax for flexion gains.  Percussive device to Lt quad.   Vaso: to Lt knee, medium pressure, 34deg, BLE elevated   TREATMENT                                                                          DATE:  04/27/2023 Therex: UBE LE only for ROM, seat 12 10.5 mins lvl 3.0 Incline gastroc stretch 30 sec x 3  Supine AAROM heel slide c strap 15 sec x 6  Knee extension machine double leg up single leg down Lt leg 5 lbs (slot 3 from back) 3 x 10 with 1 min flexion steady stretch after each rep.    Theract: (to improve stairs, transfers, squatting) Leg press double leg 87 lbs 2 x 15 with slow lowering in available flexion range, single leg Lt 31 lbs 2 x 15 - pauses into flexion for mobility gain.  Step forward WB 2 x 15 6 inch step with light hand assist   Manual: Flexion with IR and distraction with overpressure with contract relax for flexion gains.  Percussive device to Lt quad during.   Vaso: to Lt knee, medium pressure, 34deg, BLE elevated   TREATMENT                                                                          DATE:  04/23/2023 Therex: UBE LE only for ROM, seat 11 10 mins lvl 3.0 Supine AROM heel slide 5 sec hold x 10  Knee extension machine double leg up single leg down Lt leg 5 lbs (slot 3 from back) 2 x 10 with 1 min flexion steady stretch after each rep.    Theract: (to improve  stairs, transfers, squatting) Leg press double leg 75 lbs x 15 with slow lowering in available flexion range, single leg Lt 25 lbs 2 x 15   Manual: Flexion with IR and distraction with overpressure with contract relax for flexion gains.   Vaso: to Lt knee, medium pressure, 34deg, BLE elevated   PATIENT EDUCATION:  04/01/2023 Education details: HEP, POC Person educated: Patient Education method: Explanation, Demonstration, Verbal cues, and Handouts Education comprehension: verbalized understanding, returned demonstration, and verbal cues required  HOME EXERCISE PROGRAM: Access Code: [email or web address] URL: [email or web address] Date: 04/01/2023 Prepared by: Chyrel Masson   Exercises - Supine Heel Slide with Strap  - 3-5 x daily - 7 x weekly - 1 sets - 10 reps - 5-10 hold - Seated Knee Flexion AAROM (Mirrored)  - 3-5 x daily - 7 x weekly - 1 sets - 5 reps - 10-15 hold - Quad Setting and Stretching  - 3-5 x daily - 7 x weekly - 1 sets - 10 reps - 5 hold -  Seated Quad Set (Mirrored)  - 3-5 x daily - 7 x weekly - 1 sets - 10 reps - 5 hold - Seated Long Arc Quad (Mirrored)  - 3-5 x daily - 7 x weekly - 1 sets - 5-10 reps - 2 hold - Long Sitting Calf Stretch with Strap  - 2-3 x daily - 7 x weekly - 1 sets - 3-5 reps - 15-30 hold - Seated Gastroc Stretch with Strap (Mirrored)  - 2-3 x daily - 7 x weekly - 1 sets - 5 reps - 30 hold  ASSESSMENT:  CLINICAL IMPRESSION: Flexion range continued to slowly but steadily improvement in last few visits.  Continued emphasis on gaining motion at this time warranted with skilled PT services.    OBJECTIVE IMPAIRMENTS: Abnormal gait, decreased activity tolerance, decreased balance, decreased coordination, decreased endurance, decreased knowledge of use of DME, decreased mobility, difficulty walking, decreased ROM, decreased strength, hypomobility, increased edema, impaired perceived functional ability, impaired flexibility, and pain.    ACTIVITY LIMITATIONS: carrying, lifting, bending, sitting, standing, squatting, stairs, and locomotion level  PARTICIPATION LIMITATIONS: meal prep, cleaning, laundry, personal finances, driving, shopping, community activity, occupation, and yard work  PERSONAL FACTORS: Time since onset of injury/illness/exacerbation and 1-2 comorbidities: see above  are also affecting patient's functional outcome.   REHAB POTENTIAL: Good  CLINICAL DECISION MAKING: Stable/uncomplicated  EVALUATION COMPLEXITY: Low   GOALS: Goals reviewed with patient? Yes  SHORT TERM GOALS: (target date for Short term goals are 3 weeks 04/22/2023)   1.  Patient will demonstrate independent use of home exercise program to maintain progress from in clinic treatments.  Goal status: ongoing 04/08/2023  LONG TERM GOALS: (target dates for all long term goals are 10 weeks  06/10/2023 )   1. Patient will demonstrate/report pain at worst less than or equal to 2/10 to facilitate minimal limitation in daily activity secondary to pain symptoms.  Goal status: ongoing 04/08/2023   2. Patient will demonstrate independent use of home exercise program to facilitate ability to maintain/progress functional gains from skilled physical therapy services.  Goal status: ongoing 04/08/2023   3. Patient will demonstrate Patient specific functional scale avg > or = 8 to indicate reduced disability due to condition.   Goal status: ongoing 04/08/2023   4.  Patient will demonstrate Lt LE MMT 5/5 throughout to faciltiate usual transfers, stairs, squatting at [facility] for daily life.   Goal status: ongoing 04/08/2023   5.  Patient will demonstrate up and down a flight of stairs with single hand rail with reciprocal gait pattern Goal status: ongoing 04/08/2023   6.  Patient will demonstrate independent ambulation > 500 ft s deviation due to condition.   Goal status: ongoing 04/08/2023   7.  Patient will demonstrate AROM 0-115deg to facilitate usual  mobility in daily activity at PLOF.  Goal Status: ongoing 04/08/2023   PLAN:  PT FREQUENCY: 2-3x/week (starting at 3x)  PT DURATION: 10 weeks  PLANNED INTERVENTIONS: Can include 16109- PT Re-evaluation, 97110-Therapeutic exercises, 97530- Therapeutic activity, 97112- Neuromuscular re-education, 97535- Self Care, 97140- Manual therapy, [phone]- Gait training, [phone]- Orthotic Fit/training, [phone]- Canalith repositioning, [id number]- Aquatic Therapy, [phone]- Electrical stimulation (unattended), [phone]- Electrical stimulation (manual), [id number] Physical performance testing, 97016- Vasopneumatic device, [id number]- Ultrasound, [id number]- Traction (mechanical), [id number]- Ionotophoresis 4mg /ml Dexamethasone, Patient/Family education, Balance training, Stair training, Taping, Dry Needling, Joint mobilization, Joint manipulation, Spinal manipulation, Spinal mobilization, Scar mobilization, Vestibular training, Visual/preceptual remediation/compensation, DME instructions, Cryotherapy, and Moist heat.  All performed as medically necessary.  All included unless  contraindicated  PLAN FOR NEXT SESSION: Flexion gains!   Chyrel Masson, PT, DPT, OCS, ATC 05/04/23  2:52 PM

## 2023-05-07 ENCOUNTER — Ambulatory Visit (INDEPENDENT_AMBULATORY_CARE_PROVIDER_SITE_OTHER): Payer: BC Managed Care – PPO | Admitting: Rehabilitative and Restorative Service Providers"

## 2023-05-07 ENCOUNTER — Encounter: Payer: Self-pay | Admitting: Rehabilitative and Restorative Service Providers"

## 2023-05-07 DIAGNOSIS — M6281 Muscle weakness (generalized): Secondary | ICD-10-CM

## 2023-05-07 DIAGNOSIS — M25662 Stiffness of left knee, not elsewhere classified: Secondary | ICD-10-CM | POA: Diagnosis not present

## 2023-05-07 DIAGNOSIS — R600 Localized edema: Secondary | ICD-10-CM

## 2023-05-07 DIAGNOSIS — M25562 Pain in left knee: Secondary | ICD-10-CM

## 2023-05-07 DIAGNOSIS — R2689 Other abnormalities of gait and mobility: Secondary | ICD-10-CM | POA: Diagnosis not present

## 2023-05-07 DIAGNOSIS — G8929 Other chronic pain: Secondary | ICD-10-CM

## 2023-05-07 NOTE — Therapy (Signed)
 OUTPATIENT PHYSICAL THERAPY TREATMENT    Patient Name: Gregory Howard MRN: [id number] DOB:20-Oct-1960, 63 y.o., male Today's Date: 05/07/2023  END OF SESSION:  PT End of Session - 05/07/23 1258     Visit Number 14    Number of Visits 25    Date for PT Re-Evaluation 06/11/23    Authorization Type [employer] 20% coinsurance    Progress Note Due on Visit 20    PT Start Time 1258    PT Stop Time 1347    PT Time Calculation (min) 49 min    Activity Tolerance Patient tolerated treatment well    Behavior During Therapy WFL for tasks assessed/performed                  Past Medical History:  Diagnosis Date   Arthritis    History of kidney stones    Hypertension    Kidney stones    Pre-diabetes    Past Surgical History:  Procedure Laterality Date   EXTRACORPOREAL SHOCK WAVE LITHOTRIPSY Right 03/23/2017   Procedure: RIGHT EXTRACORPOREAL SHOCK WAVE LITHOTRIPSY (ESWL);  Surgeon: Marcine Matar, MD;  Location: [facility];  Service: Urology;  Laterality: Right;   HERNIA REPAIR     done as child   LITHOTRIPSY     TOTAL HIP ARTHROPLASTY Left 08/24/2020   Procedure: LEFT TOTAL HIP ARTHROPLASTY ANTERIOR APPROACH;  Surgeon: [name], MD;  Location: [facility];  Service: Orthopedics;  Laterality: Left;   TOTAL KNEE ARTHROPLASTY Left 03/13/2023   Procedure: LEFT TOTAL KNEE ARTHROPLASTY;  Surgeon: [name], MD;  Location: [facility];  Service: Orthopedics;  Laterality: Left;   Patient Active Problem List   Diagnosis Date Noted   Status post total left knee replacement 03/13/2023   Status post total replacement of left hip 08/24/2020    PCP: Daisy Floro, MD  REFERRING PROVIDER: [name]  REFERRING DIAG:  W09.811 Status post total left knee replacement - Primary    THERAPY DIAG:  Chronic pain of left knee  Muscle weakness (generalized)  Stiffness of left knee, not elsewhere classified  Other abnormalities of gait and  mobility  Localized edema  Rationale for Evaluation and Treatment: Rehabilitation  ONSET DATE: 03/13/2023 Lt TKA  SUBJECTIVE:   SUBJECTIVE STATEMENT: Pt indicated hoping he was doing better.  No other specific complaints noted.   PERTINENT HISTORY: Lt THA (2022), HTN, arthritis   PAIN:  NPRS scale: mild at rest Pain location: Lt knee Pain description: sharp when at end range  Aggravating factors: bending the knee Relieving factors: icing, meds, rest  PRECAUTIONS: None  WEIGHT BEARING RESTRICTIONS: No  FALLS:  Has patient fallen in last 6 months? No  LIVING ENVIRONMENT: Lives with: lives with their family and lives with their spouse Lives in: House/apartment Stairs: Yes: External: 3 steps; none Has following equipment at home: Single point cane, Walker - 2 wheeled, Marine scientist  OCCUPATION: short term disability; does Biomedical engineer. Does need to be able to squat/bend down/ stand for long periods of time and will be wearing heavy steel toe boots. Needs to go back to work 12 wks from now (around 1st or 2nd week of May)  PLOF: Independent  PATIENT GOALS: "get back to normal and get back to work"   Next MD visit: 04/27/2023 Dr. Magnus Ivan   OBJECTIVE:   DIAGNOSTIC FINDINGS:  FINDINGS: Interval total left knee arthroplasty. No perihardware lucency is seen to indicate hardware failure or loosening. Expected postoperative changes including intra-articular  and subcutaneous air. Smalljoint effusion. Anterior surgical skin staples. Mild chronic enthesopathic change at the quadriceps insertion on the patella. No acute fracture or dislocation.  PATIENT SURVEYS:   Patient-specific activity scoring scheme   "0" represents "unable to perform." "10" represents "able to perform at prior level. 0 1 2 3 4 5 6 7 8 9  10 (Date and Score) Activity Initial  Activity Eval 04/01/2023 05/04/2023  Bending/straightening knee for don/doff shoes 0   2. Stairs in reciprocal pattern 0    3. Standing for long periods of time 3   4.    5.     Total score = sum of the activity scores/number of activities Minimum detectable change (90%CI) for average score = 2 points Minimum detectable change (90%CI) for single activity score = 3 points  Score: 1/10   COGNITION: 04/01/2023 Overall cognitive status: WFL    SENSATION: 04/01/2023 [facility]  EDEMA:  04/01/2023 Circumferential: above knee: 45cm; below knee: 37.5cm  MUSCLE LENGTH: 04/01/2023 Not tested  POSTURE:  04/01/2023 No Significant postural limitations  PALPATION: 04/01/2023 Non-tender to calf/quad/hamstrings. Notable HS tightness.  LOWER EXTREMITY ROM:   ROM Left 04/01/2023 Left 04/02/2023 Left 04/06/23 Left 04/08/2023 Left 04/14/2023 Left 04/15/2023 Left 04/23/2023 Left 04/27/2023 Left 05/04/2023  Hip flexion           Hip extension           Hip abduction           Hip adduction           Hip internal rotation           Hip external rotation           Knee flexion A:72 P: not attempted due to patient pain and tightness Supine with strap AAROM:78 Supine A: 85 P: 90 Supine:  AAROM 85 Supine AROM heel slide 93  Supine AAROM with strap 90 Supine AROM heel slide 95 Supine AAROM with strap 100 deg.  Supine AROM heel slide: 104  Knee extension A: 4 P: 0 A:2  A: 2 P: 0        Ankle dorsiflexion           Ankle plantarflexion           Ankle inversion           Ankle eversion            (Blank rows = not tested)  LOWER EXTREMITY MMT:  MMT Right 04/01/2023 Left 04/01/2023 Left 04/14/2023  Hip flexion 5 5   Hip extension     Hip abduction     Hip adduction     Hip internal rotation     Hip external rotation     Knee flexion 5 3-   Knee extension 5 3- 5/5 (in available range)  Ankle dorsiflexion 5 5   Ankle plantarflexion     Ankle inversion     Ankle eversion      (Blank rows = not tested)  LOWER EXTREMITY SPECIAL TESTS:  04/01/2023 None tested  FUNCTIONAL TESTS:  04/01/2023 Not tested    GAIT: 04/30/2023:  ambulates with SPC at times just carrying SPC in hand. Good balance noted however flexion remains decreased. Encouraged to continue use with SPC until better gait mechanics and overall strength are achieved.  04/20/2023: Able to perform household distances in clinic without [facility] but SPC use recommended in community still to offload LLE  04/01/2023 Distance walked: clinic distance Assistive device utilized: Single point cane Level  of assistance: Modified independence Comments: decreased knee extension, decreased stance time on LLE, decreased step length, foot flat due to decreased ability to extend knee- patient able to DF appropriately as seen in MMT                                                                                                                                                                         TREATMENT                                                                          DATE:  05/07/2023 Therex: UBE LE only for ROM, seat 11 10 mins lvl 3.0 Incline gastroc runner stretch Lt leg posterior 30 sec x 5  Knee extension machine double leg up single leg down Lt leg 15 lbs 2 x 15 slot 3 Long duration stretch on knee machine slot 2 from back 5 lbs 3 mins  Single leg leg hamstring curl 15 lbs 2 x 15 Lt leg   Neuro Re-ed Tandem stance on foam 1 min x 1 bilateral Tandem ambulation on foam 6 ft x 5 each way fwd/back in // bars  SLS on black mat with contralateral leg corner touching x 8, performed bilaterally with occasional HHA on bar  Manual: Flexion with IR and distraction with overpressure with contract relax for flexion gains.  Percussive device to Lt quad.   Vaso: to Lt knee, medium pressure, 34deg, BLE elevated   TREATMENT                                                                          DATE:  05/04/2023 Therex: UBE LE only for ROM, seat 11 10 mins lvl 3.0 Incline gastroc runner stretch Lt leg posterior 30 sec x 5  Knee extension  machine double leg up single leg down Lt leg 15 lbs 2 x 15 Long duration stretch on knee machine slot 2 from back 5 lbs 3 mins   Theract: (to improve stairs, transfers, squatting) Leg press double leg 87 lbs 2 x 15 with slow lowering in available flexion range, single leg Lt 37 lbs 3 x 15 - pauses into flexion for mobility gain.  Step on over and down WB on Lt leg 6 inch step with single hand assist x 16  Manual: Flexion with IR and distraction with overpressure with contract relax for flexion gains.  Percussive device to Lt quad.   Vaso: to Lt knee, medium pressure, 34deg, BLE elevated   TREATMENT                                                                          DATE:  04/30/2023 Therex: UBE LE only for ROM, seat 12 10 mins lvl 3.0 Knee extension machine double leg up single leg down Lt leg 5 lbs (slot 3 from back) 3 x 10 with 1 min flexion steady stretch after each rep.  Theract: (to improve stairs, transfers, squatting) Leg press double leg 87 lbs 2 x 15 with slow lowering in available flexion range, single leg Lt 31 lbs 3 x 15 - pauses into flexion for mobility gain.  12 steps Lt railing descending with step to pattern alternating lowering leg, LLE leading on ascent  Manual: Flexion with IR and distraction with overpressure with contract relax for flexion gains.  Percussive device to Lt quad.   Vaso: to Lt knee, medium pressure, 34deg, BLE elevated   TREATMENT                                                                          DATE:  04/27/2023 Therex: UBE LE only for ROM, seat 12 10.5 mins lvl 3.0 Incline gastroc stretch 30 sec x 3  Supine AAROM heel slide c strap 15 sec x 6  Knee extension machine double leg up single leg down Lt leg 5 lbs (slot 3 from back) 3 x 10 with 1 min flexion steady stretch after each rep.  Supine Lt knee LAQ in 90 deg hip flexion x 10    Theract: (to improve stairs, transfers, squatting) Leg press double leg 87 lbs 2 x 15  with slow lowering in available flexion range, single leg Lt 31 lbs 2 x 15 - pauses into flexion for mobility gain.  Step forward WB 2 x 15 6 inch step with light hand assist   Manual: Flexion with IR and distraction with overpressure with contract relax for flexion gains.  Percussive device to Lt quad during.   Vaso: to Lt knee, medium pressure, 34deg, BLE elevated    PATIENT EDUCATION:  04/01/2023 Education details: HEP, POC Person educated: Patient Education method: Explanation, Demonstration, Verbal cues, and Handouts Education comprehension: verbalized understanding, returned demonstration, and verbal cues required  HOME EXERCISE PROGRAM: Access Code: [email or web address] URL: [email or web address] Date: 04/01/2023 Prepared by: Chyrel Masson   Exercises - Supine Heel Slide with Strap  - 3-5 x daily - 7 x weekly - 1 sets - 10 reps - 5-10 hold - Seated Knee Flexion AAROM (Mirrored)  - 3-5 x daily - 7 x weekly - 1 sets - 5 reps - 10-15 hold - Quad  Setting and Stretching  - 3-5 x daily - 7 x weekly - 1 sets - 10 reps - 5 hold - Seated Quad Set (Mirrored)  - 3-5 x daily - 7 x weekly - 1 sets - 10 reps - 5 hold - Seated Long Arc Quad (Mirrored)  - 3-5 x daily - 7 x weekly - 1 sets - 5-10 reps - 2 hold - Long Sitting Calf Stretch with Strap  - 2-3 x daily - 7 x weekly - 1 sets - 3-5 reps - 15-30 hold - Seated Gastroc Stretch with Strap (Mirrored)  - 2-3 x daily - 7 x weekly - 1 sets - 5 reps - 30 hold  ASSESSMENT:  CLINICAL IMPRESSION: Pt to continue to benefit from progressive strengthening in WB, balance improvements and flexion mobility gains to help improve functional activity tolerance and performance.  Return to MD office after next visit, plan for progress note with updated measurements.    OBJECTIVE IMPAIRMENTS: Abnormal gait, decreased activity tolerance, decreased balance, decreased coordination, decreased endurance, decreased knowledge of use of DME, decreased  mobility, difficulty walking, decreased ROM, decreased strength, hypomobility, increased edema, impaired perceived functional ability, impaired flexibility, and pain.   ACTIVITY LIMITATIONS: carrying, lifting, bending, sitting, standing, squatting, stairs, and locomotion level  PARTICIPATION LIMITATIONS: meal prep, cleaning, laundry, personal finances, driving, shopping, community activity, occupation, and yard work  PERSONAL FACTORS: Time since onset of injury/illness/exacerbation and 1-2 comorbidities: see above  are also affecting patient's functional outcome.   REHAB POTENTIAL: Good  CLINICAL DECISION MAKING: Stable/uncomplicated  EVALUATION COMPLEXITY: Low   GOALS: Goals reviewed with patient? Yes  SHORT TERM GOALS: (target date for Short term goals are 3 weeks 04/22/2023)   1.  Patient will demonstrate independent use of home exercise program to maintain progress from in clinic treatments.  Goal status: ongoing 04/08/2023  LONG TERM GOALS: (target dates for all long term goals are 10 weeks  06/10/2023 )   1. Patient will demonstrate/report pain at worst less than or equal to 2/10 to facilitate minimal limitation in daily activity secondary to pain symptoms.  Goal status: ongoing 04/08/2023   2. Patient will demonstrate independent use of home exercise program to facilitate ability to maintain/progress functional gains from skilled physical therapy services.  Goal status: ongoing 04/08/2023   3. Patient will demonstrate Patient specific functional scale avg > or = 8 to indicate reduced disability due to condition.   Goal status: ongoing 04/08/2023   4.  Patient will demonstrate Lt LE MMT 5/5 throughout to faciltiate usual transfers, stairs, squatting at [facility] for daily life.   Goal status: ongoing 04/08/2023   5.  Patient will demonstrate up and down a flight of stairs with single hand rail with reciprocal gait pattern Goal status: ongoing 04/08/2023   6.  Patient will demonstrate  independent ambulation > 500 ft s deviation due to condition.   Goal status: ongoing 04/08/2023   7.  Patient will demonstrate AROM 0-115deg to facilitate usual mobility in daily activity at PLOF.  Goal Status: ongoing 04/08/2023   PLAN:  PT FREQUENCY: 2-3x/week (starting at 3x)  PT DURATION: 10 weeks  PLANNED INTERVENTIONS: Can include 16109- PT Re-evaluation, 97110-Therapeutic exercises, 97530- Therapeutic activity, [id number]- Neuromuscular re-education, 97535- Self Care, 97140- Manual therapy, [phone]- Gait training, [phone]- Orthotic Fit/training, [phone]- Canalith repositioning, [id number]- Aquatic Therapy, [id number]- Electrical stimulation (unattended), [id number]- Electrical stimulation (manual), [id number] Physical performance testing, 97016- Vasopneumatic device, [id number]- Ultrasound, [id number]- Traction (mechanical), [id number]- Ionotophoresis 4mg /ml Dexamethasone, Patient/Family education, Balance training,  Stair training, Taping, Dry Needling, Joint mobilization, Joint manipulation, Spinal manipulation, Spinal mobilization, Scar mobilization, Vestibular training, Visual/preceptual remediation/compensation, DME instructions, Cryotherapy, and Moist heat.  All performed as medically necessary.  All included unless contraindicated  PLAN FOR NEXT SESSION: Progress note for MD visit.    Chyrel Masson, PT, DPT, OCS, ATC 05/07/23  1:40 PM

## 2023-05-12 ENCOUNTER — Encounter: Payer: Self-pay | Admitting: Rehabilitative and Restorative Service Providers"

## 2023-05-12 ENCOUNTER — Ambulatory Visit (INDEPENDENT_AMBULATORY_CARE_PROVIDER_SITE_OTHER): Admitting: Rehabilitative and Restorative Service Providers"

## 2023-05-12 DIAGNOSIS — M25662 Stiffness of left knee, not elsewhere classified: Secondary | ICD-10-CM | POA: Diagnosis not present

## 2023-05-12 DIAGNOSIS — M25562 Pain in left knee: Secondary | ICD-10-CM | POA: Diagnosis not present

## 2023-05-12 DIAGNOSIS — M6281 Muscle weakness (generalized): Secondary | ICD-10-CM

## 2023-05-12 DIAGNOSIS — R600 Localized edema: Secondary | ICD-10-CM

## 2023-05-12 DIAGNOSIS — G8929 Other chronic pain: Secondary | ICD-10-CM

## 2023-05-12 DIAGNOSIS — R2689 Other abnormalities of gait and mobility: Secondary | ICD-10-CM

## 2023-05-12 NOTE — Therapy (Signed)
 OUTPATIENT PHYSICAL THERAPY TREATMENT    Patient Name: Gregory Howard MRN: [id number] DOB:[birth year], 63 y.o., male Today's Date: 05/12/2023  END OF SESSION:  PT End of Session - 05/12/23 1516     Visit Number 15    Number of Visits 25    Date for PT Re-Evaluation 06/11/23    Authorization Type [employer] 20% coinsurance    Progress Note Due on Visit 20    PT Start Time 1510    PT Stop Time 1549    PT Time Calculation (min) 39 min    Activity Tolerance Patient tolerated treatment well    Behavior During Therapy WFL for tasks assessed/performed                   Past Medical History:  Diagnosis Date   Arthritis    History of kidney stones    Hypertension    Kidney stones    Pre-diabetes    Past Surgical History:  Procedure Laterality Date   EXTRACORPOREAL SHOCK WAVE LITHOTRIPSY Right 03/23/2017   Procedure: RIGHT EXTRACORPOREAL SHOCK WAVE LITHOTRIPSY (ESWL);  Surgeon: Marcine Matar, MD;  Location: [facility];  Service: Urology;  Laterality: Right;   HERNIA REPAIR     done as child   LITHOTRIPSY     TOTAL HIP ARTHROPLASTY Left 08/24/2020   Procedure: LEFT TOTAL HIP ARTHROPLASTY ANTERIOR APPROACH;  Surgeon: [name], MD;  Location: [facility];  Service: Orthopedics;  Laterality: Left;   TOTAL KNEE ARTHROPLASTY Left 03/13/2023   Procedure: LEFT TOTAL KNEE ARTHROPLASTY;  Surgeon: [name], MD;  Location: [facility];  Service: Orthopedics;  Laterality: Left;   Patient Active Problem List   Diagnosis Date Noted   Status post total left knee replacement 03/13/2023   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  Pt indicated continued working hard.  Up to 4/10 pain noted.   PERTINENT HISTORY: Lt THA (2022), HTN, arthritis   PAIN:  NPRS scale: up to 4/10 Pain location: Lt knee Pain description: sharp when at end range  Aggravating factors: bending the knee Relieving factors: icing, meds, rest  PRECAUTIONS: None  WEIGHT BEARING RESTRICTIONS: No  FALLS:  Has patient fallen in last 6 months? No  LIVING ENVIRONMENT: Lives with: lives with their family and lives with their spouse Lives in: House/apartment Stairs: Yes: External: 3 steps; none Has following equipment at home: Single point cane, Walker - 2 wheeled, Marine scientist  OCCUPATION: short term disability; does Biomedical engineer. Does need to be able to squat/bend down/ stand for long periods of time and will be wearing heavy steel toe boots. Needs to go back to work 12 wks from now (around 1st or 2nd week of May)  PLOF: Independent  PATIENT GOALS: "get back to normal and get back to work"   Next MD visit: 04/27/2023 Dr. Magnus Ivan   OBJECTIVE:   DIAGNOSTIC FINDINGS:  FINDINGS: Interval total left knee arthroplasty. No perihardware lucency is seen  to indicate hardware failure or loosening. Expected postoperative changes including intra-articular and subcutaneous air. Smalljoint effusion. Anterior surgical skin staples. Mild chronic enthesopathic change at the quadriceps insertion on the patella. No acute fracture or dislocation.  PATIENT SURVEYS:   Patient-specific activity scoring scheme   "0" represents "unable to perform." "10" represents "able to perform at prior level. 0 1 2 3 4 5 6 7 8 9  10 (Date and Score) Activity Initial  Activity Eval 04/01/2023 05/12/2023  Bending/straightening knee for  don/doff shoes 0 8  2. Stairs in reciprocal pattern 0 8  3. Standing for long periods of time 3 8       1 8  avg   Total score = sum of the activity scores/number of activities Minimum detectable change (90%CI) for average score = 2 points Minimum detectable change (90%CI) for single activity score = 3 points    COGNITION: 04/01/2023 Overall cognitive status: WFL    SENSATION: 04/01/2023 [facility]  EDEMA:  04/01/2023 Circumferential: above knee: 45cm; below knee: 37.5cm  MUSCLE LENGTH: 04/01/2023 Not tested  POSTURE:  04/01/2023 No Significant postural limitations  PALPATION: 04/01/2023 Non-tender to calf/quad/hamstrings. Notable HS tightness.  LOWER EXTREMITY ROM:   ROM Left 04/01/2023 Left 04/02/2023 Left 04/06/23 Left 04/08/2023 Left 04/14/2023 Left 04/15/2023 Left 04/23/2023 Left 04/27/2023 Left 05/04/2023 Left 05/12/2023  Hip flexion            Hip extension            Hip abduction            Hip adduction            Hip internal rotation            Hip external rotation            Knee flexion A:72 P: not attempted due to patient pain and tightness Supine with strap AAROM:78 Supine A: 85 P: 90 Supine:  AAROM 85 Supine AROM heel slide 93  Supine AAROM with strap 90 Supine AROM heel slide 95 Supine AAROM with strap 100 deg.  Supine AROM heel slide: 104 Supine AROM heel slide c strap: 104   Knee extension A: 4 P: 0 A:2  A: 2 P: 0       0  Ankle dorsiflexion            Ankle plantarflexion            Ankle inversion            Ankle eversion             (Blank rows = not tested)  LOWER EXTREMITY MMT:  MMT Right 04/01/2023 Left 04/01/2023 Left 04/14/2023  Hip flexion 5 5   Hip extension     Hip abduction     Hip adduction     Hip internal rotation     Hip external rotation     Knee flexion 5 3-   Knee extension 5 3- 5/5 (in available range)  Ankle dorsiflexion 5 5   Ankle plantarflexion     Ankle inversion     Ankle eversion      (Blank rows = not  tested)  LOWER EXTREMITY SPECIAL TESTS:  04/01/2023 None tested  FUNCTIONAL TESTS:  04/01/2023 Not tested   GAIT: 05/12/2023: Able to ambulate independently in clinic.   04/30/2023:  ambulates with SPC at times just carrying SPC in hand. Good balance noted however flexion remains decreased. Encouraged to continue use with SPC until better gait  mechanics and overall strength are achieved.  04/20/2023: Able to perform household distances in clinic without [facility] but SPC use recommended in community still to offload LLE  04/01/2023 Distance walked: clinic distance Assistive device utilized: Single point cane Level of assistance: Modified independence Comments: decreased knee extension, decreased stance time on LLE, decreased step length, foot flat due to decreased ability to extend knee- patient able to DF appropriately as seen in MMT                                                                                                                                                                         TREATMENT                                                                          DATE:  05/12/2023 Therex: UBE LE only for ROM, seat 11 10 mins lvl 3.5 Tailgate flexion swinging 1 min Incline gastroc runner stretch Lt leg posterior 30 sec x 5  Knee extension machine double leg up single leg down Lt leg 15 lbs 2 x 15 slot 3 Long duration stretch on knee machine slot 2 from back 5 lbs 4 mins  Seated heel slide AROM with strap assist 5 sec hold x 10    Neuro Re-ed SLS with contralateral leg clear over and back with focus on knee flexion x 15 bilateral 9 inch hurdle Step to pattern over 9 inch x4, 6 inch hurdle x 1 hurdle 10 ft x 4 each LE leading with focus on flexion    TREATMENT                                                                          DATE:  05/07/2023 Therex: UBE LE only for ROM, seat 11 10 mins lvl 3.0 Incline gastroc runner stretch Lt leg posterior 30 sec x 5  Knee  extension machine double leg up single leg down Lt leg 15 lbs 2 x 15 slot 3 Long duration stretch on knee machine slot 2 from back 5 lbs 3 mins  Single leg leg hamstring curl 15 lbs 2 x 15 Lt leg   Neuro Re-ed Tandem stance on foam 1  min x 1 bilateral Tandem ambulation on foam 6 ft x 5 each way fwd/back in // bars  SLS on black mat with contralateral leg corner touching x 8, performed bilaterally with occasional HHA on bar  Manual: Flexion with IR and distraction with overpressure with contract relax for flexion gains.  Percussive device to Lt quad.   Vaso: to Lt knee, medium pressure, 34deg, BLE elevated   TREATMENT                                                                          DATE:  05/04/2023 Therex: UBE LE only for ROM, seat 11 10 mins lvl 3.0 Incline gastroc runner stretch Lt leg posterior 30 sec x 5  Knee extension machine double leg up single leg down Lt leg 15 lbs 2 x 15 Long duration stretch on knee machine slot 2 from back 5 lbs 3 mins   Theract: (to improve stairs, transfers, squatting) Leg press double leg 87 lbs 2 x 15 with slow lowering in available flexion range, single leg Lt 37 lbs 3 x 15 - pauses into flexion for mobility gain.  Step on over and down WB on Lt leg 6 inch step with single hand assist x 16  Manual: Flexion with IR and distraction with overpressure with contract relax for flexion gains.  Percussive device to Lt quad.   Vaso: to Lt knee, medium pressure, 34deg, BLE elevated   TREATMENT                                                                          DATE:  04/30/2023 Therex: UBE LE only for ROM, seat 12 10 mins lvl 3.0 Knee extension machine double leg up single leg down Lt leg 5 lbs (slot 3 from back) 3 x 10 with 1 min flexion steady stretch after each rep.  Theract: (to improve stairs, transfers, squatting) Leg press double leg 87 lbs 2 x 15 with slow lowering in available flexion range, single leg Lt 31 lbs 3 x 15 -  pauses into flexion for mobility gain.  12 steps Lt railing descending with step to pattern alternating lowering leg, LLE leading on ascent  Manual: Flexion with IR and distraction with overpressure with contract relax for flexion gains.  Percussive device to Lt quad.   Vaso: to Lt knee, medium pressure, 34deg, BLE elevated     PATIENT EDUCATION:  04/01/2023 Education details: HEP, POC Person educated: Patient Education method: Explanation, Demonstration, Verbal cues, and Handouts Education comprehension: verbalized understanding, returned demonstration, and verbal cues required  HOME EXERCISE PROGRAM: Access Code: [email or web address] URL: [email or web address] Date: 04/01/2023 Prepared by: Chyrel Masson   Exercises - Supine Heel Slide with Strap  - 3-5 x daily - 7 x weekly - 1 sets - 10 reps - 5-10 hold - Seated Knee Flexion AAROM (Mirrored)  - 3-5 x daily - 7 x weekly - 1 sets - 5 reps -  10-15 hold - Quad Setting and Stretching  - 3-5 x daily - 7 x weekly - 1 sets - 10 reps - 5 hold - Seated Quad Set (Mirrored)  - 3-5 x daily - 7 x weekly - 1 sets - 10 reps - 5 hold - Seated Long Arc Quad (Mirrored)  - 3-5 x daily - 7 x weekly - 1 sets - 5-10 reps - 2 hold - Long Sitting Calf Stretch with Strap  - 2-3 x daily - 7 x weekly - 1 sets - 3-5 reps - 15-30 hold - Seated Gastroc Stretch with Strap (Mirrored)  - 2-3 x daily - 7 x weekly - 1 sets - 5 reps - 30 hold  ASSESSMENT:  CLINICAL IMPRESSION: Updated measurements today showed gains noted in PSFS.   Knee flexion continued to show slow but steady progress compared to several weeks ago.    OBJECTIVE IMPAIRMENTS: Abnormal gait, decreased activity tolerance, decreased balance, decreased coordination, decreased endurance, decreased knowledge of use of DME, decreased mobility, difficulty walking, decreased ROM, decreased strength, hypomobility, increased edema, impaired perceived functional ability, impaired flexibility, and  pain.   ACTIVITY LIMITATIONS: carrying, lifting, bending, sitting, standing, squatting, stairs, and locomotion level  PARTICIPATION LIMITATIONS: meal prep, cleaning, laundry, personal finances, driving, shopping, community activity, occupation, and yard work  PERSONAL FACTORS: Time since onset of injury/illness/exacerbation and 1-2 comorbidities: see above  are also affecting patient's functional outcome.   REHAB POTENTIAL: Good  CLINICAL DECISION MAKING: Stable/uncomplicated  EVALUATION COMPLEXITY: Low   GOALS: Goals reviewed with patient? Yes  SHORT TERM GOALS: (target date for Short term goals are 3 weeks 04/22/2023)   1.  Patient will demonstrate independent use of home exercise program to maintain progress from in clinic treatments.  Goal status: ongoing 04/08/2023  LONG TERM GOALS: (target dates for all long term goals are 10 weeks  06/10/2023 )   1. Patient will demonstrate/report pain at worst less than or equal to 2/10 to facilitate minimal limitation in daily activity secondary to pain symptoms.  Goal status: ongoing 05/12/2023   2. Patient will demonstrate independent use of home exercise program to facilitate ability to maintain/progress functional gains from skilled physical therapy services.  Goal status: ongoing 05/12/2023   3. Patient will demonstrate Patient specific functional scale avg > or = 8 to indicate reduced disability due to condition.   Goal status: ongoing 05/12/2023   4.  Patient will demonstrate Lt LE MMT 5/5 throughout to faciltiate usual transfers, stairs, squatting at [facility] for daily life.   Goal status: met 05/12/2023   5.  Patient will demonstrate up and down a flight of stairs with single hand rail with reciprocal gait pattern Goal status: ongoing 05/12/2023   6.  Patient will demonstrate independent ambulation > 500 ft s deviation due to condition.   Goal status: ongoing 05/12/2023   7.  Patient will demonstrate AROM 0-115deg to facilitate usual  mobility in daily activity at PLOF.  Goal Status: ongoing 05/12/2023   PLAN:  PT FREQUENCY: 2-3x/week (starting at 3x)  PT DURATION: 10 weeks  PLANNED INTERVENTIONS: Can include 16109- PT Re-evaluation, 97110-Therapeutic exercises, 97530- Therapeutic activity, 97112- Neuromuscular re-education, 97535- Self Care, 97140- Manual therapy, [phone]- Gait training, [phone]- Orthotic Fit/training, [phone]- Canalith repositioning, [id number]- Aquatic Therapy, [phone]- Electrical stimulation (unattended), [phone]- Electrical stimulation (manual), [id number] Physical performance testing, 97016- Vasopneumatic device, [id number]- Ultrasound, [id number]- Traction (mechanical), [id number]- Ionotophoresis 4mg /ml Dexamethasone, Patient/Family education, Balance training, Stair training, Taping, Dry Needling, Joint mobilization, Joint manipulation, Spinal manipulation,  Spinal mobilization, Scar mobilization, Vestibular training, Visual/preceptual remediation/compensation, DME instructions, Cryotherapy, and Moist heat.  All performed as medically necessary.  All included unless contraindicated  PLAN FOR NEXT SESSION: Check on MD visit response.    Chyrel Masson, PT, DPT, OCS, ATC 05/12/23  3:50 PM

## 2023-05-18 ENCOUNTER — Ambulatory Visit: Admitting: Orthopaedic Surgery

## 2023-05-18 DIAGNOSIS — Z96652 Presence of left artificial knee joint: Secondary | ICD-10-CM

## 2023-05-18 NOTE — Progress Notes (Signed)
 The patient is now just past 9 weeks status post a left knee replacement.  At his last visit we did place a steroid injection in the knee because we felt like he could push through therapy with just a steroid injection instead of manipulation.  His notes in therapy show that he has been able to flex his knee to 104 which is definitely improved from over a month ago when he was at 85 degrees.  On my exam today he has almost full extension.  There is still significant swelling with the left knee.  I could flex him to about 100 degrees.  I do feel like he is going to continue to improve and we can avoid a manipulation under anesthesia.  I will allow him to return to work starting May 1.  I would like to then see him back in 3 months with a standing AP and lateral of his left operative knee.

## 2023-05-19 ENCOUNTER — Encounter: Payer: Self-pay | Admitting: Rehabilitative and Restorative Service Providers"

## 2023-05-19 ENCOUNTER — Ambulatory Visit (INDEPENDENT_AMBULATORY_CARE_PROVIDER_SITE_OTHER): Admitting: Rehabilitative and Restorative Service Providers"

## 2023-05-19 DIAGNOSIS — M25662 Stiffness of left knee, not elsewhere classified: Secondary | ICD-10-CM | POA: Diagnosis not present

## 2023-05-19 DIAGNOSIS — M6281 Muscle weakness (generalized): Secondary | ICD-10-CM | POA: Diagnosis not present

## 2023-05-19 DIAGNOSIS — M25562 Pain in left knee: Secondary | ICD-10-CM | POA: Diagnosis not present

## 2023-05-19 DIAGNOSIS — R2689 Other abnormalities of gait and mobility: Secondary | ICD-10-CM | POA: Diagnosis not present

## 2023-05-19 DIAGNOSIS — R600 Localized edema: Secondary | ICD-10-CM

## 2023-05-19 DIAGNOSIS — G8929 Other chronic pain: Secondary | ICD-10-CM

## 2023-05-19 NOTE — Therapy (Signed)
 OUTPATIENT PHYSICAL THERAPY TREATMENT    Patient Name: Gregory Howard MRN: [id number] DOB:10/05/1960, 64 y.o., male Today's Date: 05/19/2023  END OF SESSION:  PT End of Session - 05/19/23 1255     Visit Number 16    Number of Visits 25    Date for PT Re-Evaluation 06/11/23    Authorization Type [employer] 20% coinsurance    Progress Note Due on Visit 20    PT Start Time 1255    PT Stop Time 1345    PT Time Calculation (min) 50 min    Activity Tolerance Patient tolerated treatment well    Behavior During Therapy WFL for tasks assessed/performed                    Past Medical History:  Diagnosis Date   Arthritis    History of kidney stones    Hypertension    Kidney stones    Pre-diabetes    Past Surgical History:  Procedure Laterality Date   EXTRACORPOREAL SHOCK WAVE LITHOTRIPSY Right 03/23/2017   Procedure: RIGHT EXTRACORPOREAL SHOCK WAVE LITHOTRIPSY (ESWL);  Surgeon: Marcine Matar, MD;  Location: [facility];  Service: Urology;  Laterality: Right;   HERNIA REPAIR     done as child   LITHOTRIPSY     TOTAL HIP ARTHROPLASTY Left 08/24/2020   Procedure: LEFT TOTAL HIP ARTHROPLASTY ANTERIOR APPROACH;  Surgeon: [name], MD;  Location: [facility];  Service: Orthopedics;  Laterality: Left;   TOTAL KNEE ARTHROPLASTY Left 03/13/2023   Procedure: LEFT TOTAL KNEE ARTHROPLASTY;  Surgeon: [name], MD;  Location: [facility];  Service: Orthopedics;  Laterality: Left;   Patient Active Problem List   Diagnosis Date Noted   Status post total left knee replacement 03/13/2023   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  Pt indicated MD office indicated no manipulation at this time.   PERTINENT HISTORY: Lt THA (2022), HTN, arthritis   PAIN:  NPRS scale: no pain at rest.  Pain location: Lt knee Pain description:  Aggravating factors: bending the knee Relieving factors: icing, meds, rest  PRECAUTIONS: None  WEIGHT BEARING RESTRICTIONS: No  FALLS:  Has patient fallen in last 6 months? No  LIVING ENVIRONMENT: Lives with: lives with their family and lives with their spouse Lives in: House/apartment Stairs: Yes: External: 3 steps; none Has following equipment at home: Single point cane, Walker - 2 wheeled, Marine scientist  OCCUPATION: short term disability; does Biomedical engineer. Does need to be able to squat/bend down/ stand for long periods of time and will be wearing heavy steel toe boots. Needs to go back to work 12 wks from now (around 1st or 2nd week of May)  PLOF: Independent  PATIENT GOALS: "get back to normal and get back to work"   Next MD visit: 04/27/2023 Dr. Magnus Ivan   OBJECTIVE:   DIAGNOSTIC FINDINGS:  FINDINGS: Interval total left knee arthroplasty. No perihardware lucency is seen to indicate hardware failure  or loosening. Expected postoperative changes including intra-articular and subcutaneous air. Smalljoint effusion. Anterior surgical skin staples. Mild chronic enthesopathic change at the quadriceps insertion on the patella. No acute fracture or dislocation.  PATIENT SURVEYS:   Patient-specific activity scoring scheme   "0" represents "unable to perform." "10" represents "able to perform at prior level. 0 1 2 3 4 5 6 7 8 9  10 (Date and Score) Activity Initial  Activity Eval 04/01/2023 05/12/2023  Bending/straightening knee for don/doff shoes 0 8  2. Stairs in  reciprocal pattern 0 8  3. Standing for long periods of time 3 8       1 8  avg   Total score = sum of the activity scores/number of activities Minimum detectable change (90%CI) for average score = 2 points Minimum detectable change (90%CI) for single activity score = 3 points   COGNITION: 04/01/2023 Overall cognitive status: WFL    SENSATION: 04/01/2023 [facility]  EDEMA:  04/01/2023 Circumferential: above knee: 45cm; below knee: 37.5cm  MUSCLE LENGTH: 04/01/2023 Not tested  POSTURE:  04/01/2023 No Significant postural limitations  PALPATION: 04/01/2023 Non-tender to calf/quad/hamstrings. Notable HS tightness.  LOWER EXTREMITY ROM:   ROM Left 04/01/2023 Left 04/02/2023 Left 04/06/23 Left 04/08/2023 Left 04/14/2023 Left 04/15/2023 Left 04/23/2023 Left 04/27/2023 Left 05/04/2023 Left 05/12/2023  Hip flexion            Hip extension            Hip abduction            Hip adduction            Hip internal rotation            Hip external rotation            Knee flexion A:72 P: not attempted due to patient pain and tightness Supine with strap AAROM:78 Supine A: 85 P: 90 Supine:  AAROM 85 Supine AROM heel slide 93  Supine AAROM with strap 90 Supine AROM heel slide 95 Supine AAROM with strap 100 deg.  Supine AROM heel slide: 104 Supine AROM heel slide c strap: 104   Knee extension A: 4 P: 0 A:2  A: 2 P: 0       0  Ankle dorsiflexion            Ankle plantarflexion            Ankle inversion            Ankle eversion             (Blank rows = not tested)  LOWER EXTREMITY MMT:  MMT Right 04/01/2023 Left 04/01/2023 Left 04/14/2023  Hip flexion 5 5   Hip extension     Hip abduction     Hip adduction     Hip internal rotation     Hip external rotation     Knee flexion 5 3-   Knee extension 5 3- 5/5 (in available range)  Ankle dorsiflexion 5 5   Ankle plantarflexion     Ankle inversion     Ankle eversion      (Blank rows = not tested)  LOWER EXTREMITY SPECIAL  TESTS:  04/01/2023 None tested  FUNCTIONAL TESTS:  04/01/2023 Not tested   GAIT: 05/12/2023: Able to ambulate independently in clinic.   04/30/2023:  ambulates with SPC at times just carrying SPC in hand. Good balance noted however flexion remains decreased. Encouraged to continue use with SPC until better gait mechanics and overall strength are  achieved.  04/20/2023: Able to perform household distances in clinic without [facility] but SPC use recommended in community still to offload LLE  04/01/2023 Distance walked: clinic distance Assistive device utilized: Single point cane Level of assistance: Modified independence Comments: decreased knee extension, decreased stance time on LLE, decreased step length, foot flat due to decreased ability to extend knee- patient able to DF appropriately as seen in MMT                                                                                                                                                                         TREATMENT                                                                          DATE:  05/19/2023 Therex: UBE LE only for ROM, seat 11 10 mins lvl 3.5 Incline gastroc runner stretch Lt leg posterior 30 sec x 5  Education on gym based return. Walking program ideas for work return.    TherActivity (to improve stairs, squatting, transfers) TRX over chair squats 2 x 15  Lateral step up/down 6 inch 2 x 15 c blue band tke in stance 2-3 sec - WB on Lt leg  Manual Lt knee flexion c mobilization c movement IR /distraction.  Vaso: to Lt knee, medium pressure, 34deg, BLE elevated    TREATMENT                                                                          DATE:  05/12/2023 Therex: UBE LE only for ROM, seat 11 10 mins lvl 3.5 Tailgate flexion swinging 1 min Incline gastroc runner stretch Lt leg posterior 30 sec x 5  Knee extension machine double leg up single leg down Lt leg 15 lbs 2 x 15 slot 3 Long duration stretch  on knee machine slot 2 from back 5 lbs 4 mins  Seated heel slide AROM with strap assist 5 sec hold x 10    Neuro Re-ed SLS with contralateral leg clear over and back with focus on knee flexion x 15 bilateral 9 inch hurdle Step to pattern over 9 inch x4, 6 inch hurdle x 1  hurdle 10 ft x 4 each LE leading with focus on flexion    TREATMENT                                                                          DATE:  05/07/2023 Therex: UBE LE only for ROM, seat 11 10 mins lvl 3.0 Incline gastroc runner stretch Lt leg posterior 30 sec x 5  Knee extension machine double leg up single leg down Lt leg 15 lbs 2 x 15 slot 3 Long duration stretch on knee machine slot 2 from back 5 lbs 3 mins  Single leg leg hamstring curl 15 lbs 2 x 15 Lt leg   Neuro Re-ed Tandem stance on foam 1 min x 1 bilateral Tandem ambulation on foam 6 ft x 5 each way fwd/back in // bars  SLS on black mat with contralateral leg corner touching x 8, performed bilaterally with occasional HHA on bar  Manual: Flexion with IR and distraction with overpressure with contract relax for flexion gains.  Percussive device to Lt quad.   Vaso: to Lt knee, medium pressure, 34deg, BLE elevated   TREATMENT                                                                          DATE:  05/04/2023 Therex: UBE LE only for ROM, seat 11 10 mins lvl 3.0 Incline gastroc runner stretch Lt leg posterior 30 sec x 5  Knee extension machine double leg up single leg down Lt leg 15 lbs 2 x 15 Long duration stretch on knee machine slot 2 from back 5 lbs 3 mins   Theract: (to improve stairs, transfers, squatting) Leg press double leg 87 lbs 2 x 15 with slow lowering in available flexion range, single leg Lt 37 lbs 3 x 15 - pauses into flexion for mobility gain.  Step on over and down WB on Lt leg 6 inch step with single hand assist x 16  Manual: Flexion with IR and distraction with overpressure with contract relax for flexion gains.   Percussive device to Lt quad.   Vaso: to Lt knee, medium pressure, 34deg, BLE elevated   PATIENT EDUCATION:  04/01/2023 Education details: HEP, POC Person educated: Patient Education method: Explanation, Demonstration, Verbal cues, and Handouts Education comprehension: verbalized understanding, returned demonstration, and verbal cues required  HOME EXERCISE PROGRAM: Access Code: [email or web address] URL: [email or web address] Date: 04/01/2023 Prepared by: Chyrel Masson   Exercises - Supine Heel Slide with Strap  - 3-5 x daily - 7 x weekly - 1 sets - 10 reps - 5-10 hold - Seated Knee Flexion AAROM (Mirrored)  - 3-5 x daily - 7 x weekly - 1 sets - 5 reps - 10-15 hold - Quad Setting and Stretching  - 3-5 x daily - 7 x weekly - 1 sets - 10 reps - 5 hold - Seated Quad Set (Mirrored)  - 3-5 x daily - 7 x weekly -  1 sets - 10 reps - 5 hold - Seated Long Arc Quad (Mirrored)  - 3-5 x daily - 7 x weekly - 1 sets - 5-10 reps - 2 hold - Long Sitting Calf Stretch with Strap  - 2-3 x daily - 7 x weekly - 1 sets - 3-5 reps - 15-30 hold - Seated Gastroc Stretch with Strap (Mirrored)  - 2-3 x daily - 7 x weekly - 1 sets - 5 reps - 30 hold  ASSESSMENT:  CLINICAL IMPRESSION: Encouraged progressive standing/walking increased during day to prepare work return as well as review of at home ROM, elevation for edema control.    OBJECTIVE IMPAIRMENTS: Abnormal gait, decreased activity tolerance, decreased balance, decreased coordination, decreased endurance, decreased knowledge of use of DME, decreased mobility, difficulty walking, decreased ROM, decreased strength, hypomobility, increased edema, impaired perceived functional ability, impaired flexibility, and pain.   ACTIVITY LIMITATIONS: carrying, lifting, bending, sitting, standing, squatting, stairs, and locomotion level  PARTICIPATION LIMITATIONS: meal prep, cleaning, laundry, personal finances, driving, shopping, community activity,  occupation, and yard work  PERSONAL FACTORS: Time since onset of injury/illness/exacerbation and 1-2 comorbidities: see above  are also affecting patient's functional outcome.   REHAB POTENTIAL: Good  CLINICAL DECISION MAKING: Stable/uncomplicated  EVALUATION COMPLEXITY: Low   GOALS: Goals reviewed with patient? Yes  SHORT TERM GOALS: (target date for Short term goals are 3 weeks 04/22/2023)   1.  Patient will demonstrate independent use of home exercise program to maintain progress from in clinic treatments.  Goal status: ongoing 04/08/2023  LONG TERM GOALS: (target dates for all long term goals are 10 weeks  06/10/2023 )   1. Patient will demonstrate/report pain at worst less than or equal to 2/10 to facilitate minimal limitation in daily activity secondary to pain symptoms.  Goal status: ongoing 05/12/2023   2. Patient will demonstrate independent use of home exercise program to facilitate ability to maintain/progress functional gains from skilled physical therapy services.  Goal status: ongoing 05/12/2023   3. Patient will demonstrate Patient specific functional scale avg > or = 8 to indicate reduced disability due to condition.   Goal status: ongoing 05/12/2023   4.  Patient will demonstrate Lt LE MMT 5/5 throughout to faciltiate usual transfers, stairs, squatting at [facility] for daily life.   Goal status: met 05/12/2023   5.  Patient will demonstrate up and down a flight of stairs with single hand rail with reciprocal gait pattern Goal status: ongoing 05/12/2023   6.  Patient will demonstrate independent ambulation > 500 ft s deviation due to condition.   Goal status: ongoing 05/12/2023   7.  Patient will demonstrate AROM 0-115deg to facilitate usual mobility in daily activity at PLOF.  Goal Status: ongoing 05/12/2023   PLAN:  PT FREQUENCY: 2-3x/week (starting at 3x)  PT DURATION: 10 weeks  PLANNED INTERVENTIONS: Can include 16109- PT Re-evaluation, 97110-Therapeutic exercises,  97530- Therapeutic activity, 97112- Neuromuscular re-education, 97535- Self Care, 97140- Manual therapy, [phone]- Gait training, [phone]- Orthotic Fit/training, [phone]- Canalith repositioning, [id number]- Aquatic Therapy, [phone]- Electrical stimulation (unattended), [phone]- Electrical stimulation (manual), [id number] Physical performance testing, 97016- Vasopneumatic device, [id number]- Ultrasound, [id number]- Traction (mechanical), [id number]- Ionotophoresis 4mg /ml Dexamethasone, Patient/Family education, Balance training, Stair training, Taping, Dry Needling, Joint mobilization, Joint manipulation, Spinal manipulation, Spinal mobilization, Scar mobilization, Vestibular training, Visual/preceptual remediation/compensation, DME instructions, Cryotherapy, and Moist heat.  All performed as medically necessary.  All included unless contraindicated  PLAN FOR NEXT SESSION: Progressive dynamic balance, mobility gains.    Chyrel Masson, PT, DPT,  OCS, ATC 05/19/23  1:49 PM

## 2023-05-21 ENCOUNTER — Encounter: Payer: Self-pay | Admitting: Rehabilitative and Restorative Service Providers"

## 2023-05-21 ENCOUNTER — Ambulatory Visit (INDEPENDENT_AMBULATORY_CARE_PROVIDER_SITE_OTHER): Admitting: Rehabilitative and Restorative Service Providers"

## 2023-05-21 DIAGNOSIS — R2689 Other abnormalities of gait and mobility: Secondary | ICD-10-CM

## 2023-05-21 DIAGNOSIS — M6281 Muscle weakness (generalized): Secondary | ICD-10-CM | POA: Diagnosis not present

## 2023-05-21 DIAGNOSIS — M25662 Stiffness of left knee, not elsewhere classified: Secondary | ICD-10-CM

## 2023-05-21 DIAGNOSIS — R600 Localized edema: Secondary | ICD-10-CM

## 2023-05-21 DIAGNOSIS — M25562 Pain in left knee: Secondary | ICD-10-CM

## 2023-05-21 DIAGNOSIS — G8929 Other chronic pain: Secondary | ICD-10-CM

## 2023-05-21 NOTE — Therapy (Signed)
 OUTPATIENT PHYSICAL THERAPY TREATMENT    Patient Name: Gregory Howard MRN: [id number] DOB:11-Jul-1960, 63 y.o., male Today's Date: 05/21/2023  END OF SESSION:  PT End of Session - 05/21/23 1300     Visit Number 17    Number of Visits 25    Date for PT Re-Evaluation 06/11/23    Authorization Type [employer] 20% coinsurance    Progress Note Due on Visit 20    PT Start Time 1300    PT Stop Time 1349    PT Time Calculation (min) 49 min    Activity Tolerance Patient tolerated treatment well    Behavior During Therapy WFL for tasks assessed/performed                     Past Medical History:  Diagnosis Date   Arthritis    History of kidney stones    Hypertension    Kidney stones    Pre-diabetes    Past Surgical History:  Procedure Laterality Date   EXTRACORPOREAL SHOCK WAVE LITHOTRIPSY Right 03/23/2017   Procedure: RIGHT EXTRACORPOREAL SHOCK WAVE LITHOTRIPSY (ESWL);  Surgeon: Trent Frizzle, MD;  Location: [facility];  Service: Urology;  Laterality: Right;   HERNIA REPAIR     done as child   LITHOTRIPSY     TOTAL HIP ARTHROPLASTY Left 08/24/2020   Procedure: LEFT TOTAL HIP ARTHROPLASTY ANTERIOR APPROACH;  Surgeon: Arnie Lao, MD;  Location: [facility];  Service: Orthopedics;  Laterality: Left;   TOTAL KNEE ARTHROPLASTY Left 03/13/2023   Procedure: LEFT TOTAL KNEE ARTHROPLASTY;  Surgeon: Arnie Lao, MD;  Location: [facility];  Service: Orthopedics;  Laterality: Left;   Patient Active Problem List   Diagnosis Date Noted   Status post total left knee replacement 03/13/2023   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    PERTINENT HISTORY: Lt THA (2022), HTN, arthritis   PAIN:  NPRS scale: no pain at rest.  Pain location: Lt knee Pain description:  Aggravating factors: bending the knee Relieving factors: icing, meds, rest  PRECAUTIONS: None  WEIGHT BEARING RESTRICTIONS: No  FALLS:  Has patient fallen in last 6 months? No  LIVING ENVIRONMENT: Lives with: lives with their family and lives with their spouse Lives in: House/apartment Stairs: Yes: External: 3 steps; none Has following equipment at home: Single point cane, Walker - 2 wheeled, Marine scientist  OCCUPATION: short term disability; does Biomedical engineer. Does need to be able to squat/bend down/ stand for long periods of time and will be wearing heavy steel toe boots. Needs to go back to work 12 wks from now (around 1st or 2nd week of May)  PLOF: Independent  PATIENT GOALS: "get back to normal and get back to work"   Next MD visit: 04/27/2023 Dr. Lucienne Ryder   OBJECTIVE:   DIAGNOSTIC FINDINGS:  FINDINGS: Interval total left knee arthroplasty. No perihardware lucency is seen to indicate hardware failure or loosening. Expected postoperative changes including intra-articular and subcutaneous air.  Smalljoint effusion. Anterior surgical skin staples. Mild chronic enthesopathic change at the quadriceps insertion on the patella. No acute fracture or dislocation.  PATIENT SURVEYS:   Patient-specific activity scoring scheme   "0" represents "unable to perform." "10" represents "able to perform at prior level. 0 1 2 3 4 5 6 7 8 9  10 (Date and Score) Activity Initial  Activity Eval 04/01/2023 05/12/2023  Bending/straightening knee for don/doff shoes 0 8  2. Stairs in reciprocal pattern 0 8  3. Standing for long periods of  time 3 8       1 8  avg   Total score = sum of the activity scores/number of activities Minimum detectable change (90%CI) for average score = 2 points Minimum detectable change (90%CI) for single activity score = 3 points   COGNITION: 04/01/2023 Overall cognitive status: WFL    SENSATION: 04/01/2023 [facility]  EDEMA:  04/01/2023 Circumferential: above knee: 45cm; below knee: 37.5cm  MUSCLE LENGTH: 04/01/2023 Not tested  POSTURE:  04/01/2023 No Significant postural limitations  PALPATION: 04/01/2023 Non-tender to calf/quad/hamstrings. Notable HS tightness.  LOWER EXTREMITY ROM:   ROM Left 04/01/2023 Left 04/02/2023 Left 04/06/23 Left 04/08/2023 Left 04/14/2023 Left 04/15/2023 Left 04/23/2023 Left 04/27/2023 Left 05/04/2023 Left 05/12/2023  Hip flexion            Hip extension            Hip abduction            Hip adduction            Hip internal rotation            Hip external rotation            Knee flexion A:72 P: not attempted due to patient pain and tightness Supine with strap AAROM:78 Supine A: 85 P: 90 Supine:  AAROM 85 Supine AROM heel slide 93  Supine AAROM with strap 90 Supine AROM heel slide 95 Supine AAROM with strap 100 deg.  Supine AROM heel slide: 104 Supine AROM heel slide c strap: 104   Knee extension A: 4 P: 0 A:2  A: 2 P: 0       0  Ankle dorsiflexion            Ankle plantarflexion            Ankle inversion            Ankle eversion             (Blank rows = not tested)  LOWER EXTREMITY MMT:  MMT Right 04/01/2023 Left 04/01/2023 Left 04/14/2023  Hip flexion 5 5   Hip extension     Hip abduction     Hip adduction     Hip internal rotation     Hip external rotation     Knee flexion 5 3-   Knee extension 5 3- 5/5 (in available range)  Ankle dorsiflexion 5 5   Ankle plantarflexion     Ankle inversion     Ankle eversion      (Blank rows = not tested)  LOWER EXTREMITY SPECIAL TESTS:  04/01/2023 None tested  FUNCTIONAL TESTS:   04/01/2023 Not tested   GAIT: 05/12/2023: Able to ambulate independently in clinic.   04/30/2023:  ambulates with SPC at times just carrying SPC in hand. Good balance noted however flexion remains decreased. Encouraged to continue use with SPC until better gait mechanics and overall strength are achieved.  04/20/2023: Able to perform household distances in clinic  without SPC but SPC use recommended in community still to offload LLE  04/01/2023 Distance walked: clinic distance Assistive device utilized: Single point cane Level of assistance: Modified independence Comments: decreased knee extension, decreased stance time on LLE, decreased step length, foot flat due to decreased ability to extend knee- patient able to DF appropriately as seen in MMT                                                                                                                                                                         TREATMENT                                                                          DATE:  05/21/2023 Therex: UBE LE only for ROM, seat 11 10 mins lvl 3.5 Incline gastroc runner stretch Lt leg posterior 30 sec x 5  Knee extension machine double leg up single leg down Lt leg 15 lbs 2 x 15 slot 3 Long duration stretch on knee machine slot 2 from back 5 lbs 4 mins  Seated LAQ extension/flexion pause Lt knee x 15  Neuro Re-ed SLS on airex foam with contralateral corner touching x 8 each, performed bilaterally Lateral stepping 3 cones x 8 each, performed bilaterally    TherActivity (to improve stairs, squatting, transfers) Step on over and down WB on Lt leg 6 inch step x 15 no UE assist TRX with strap over chair 2 x 15  Vaso 10 mins Lt knee in elevation medium compression 34 degrees  TREATMENT                                                                          DATE:  05/19/2023 Therex: UBE LE only for ROM, seat 11 10 mins lvl 3.5 Incline gastroc runner stretch Lt leg  posterior 30 sec x 5  Education on gym based return. Walking program ideas for work return.    TherActivity (to improve stairs, squatting, transfers) TRX over chair squats 2 x 15  Lateral step up/down 6 inch 2 x 15 c blue band tke in stance 2-3 sec - WB on Lt leg  Manual Lt knee flexion c mobilization c  movement IR /distraction.    TREATMENT                                                                          DATE:  05/12/2023 Therex: UBE LE only for ROM, seat 11 10 mins lvl 3.5 Tailgate flexion swinging 1 min Incline gastroc runner stretch Lt leg posterior 30 sec x 5  Knee extension machine double leg up single leg down Lt leg 15 lbs 2 x 15 slot 3 Long duration stretch on knee machine slot 2 from back 5 lbs 4 mins  Seated heel slide AROM with strap assist 5 sec hold x 10    Neuro Re-ed SLS with contralateral leg clear over and back with focus on knee flexion x 15 bilateral 9 inch hurdle Step to pattern over 9 inch x4, 6 inch hurdle x 1 hurdle 10 ft x 4 each LE leading with focus on flexion     PATIENT EDUCATION:  04/01/2023 Education details: HEP, POC Person educated: Patient Education method: Programmer, multimedia, Demonstration, Verbal cues, and Handouts Education comprehension: verbalized understanding, returned demonstration, and verbal cues required  HOME EXERCISE PROGRAM: Access Code: [email or web address] URL: [email or web address] Date: 04/01/2023 Prepared by: Chyrel Masson   Exercises - Supine Heel Slide with Strap  - 3-5 x daily - 7 x weekly - 1 sets - 10 reps - 5-10 hold - Seated Knee Flexion AAROM (Mirrored)  - 3-5 x daily - 7 x weekly - 1 sets - 5 reps - 10-15 hold - Quad Setting and Stretching  - 3-5 x daily - 7 x weekly - 1 sets - 10 reps - 5 hold - Seated Quad Set (Mirrored)  - 3-5 x daily - 7 x weekly - 1 sets - 10 reps - 5 hold - Seated Long Arc Quad (Mirrored)  - 3-5 x daily - 7 x weekly - 1 sets - 5-10 reps - 2 hold - Long Sitting Calf Stretch with Strap   - 2-3 x daily - 7 x weekly - 1 sets - 3-5 reps - 15-30 hold - Seated Gastroc Stretch with Strap (Mirrored)  - 2-3 x daily - 7 x weekly - 1 sets - 5 reps - 30 hold  ASSESSMENT:  CLINICAL IMPRESSION: Good overall performance on activity in balance and strengthening today.  Improving eccentric step down control.    OBJECTIVE IMPAIRMENTS: Abnormal gait, decreased activity tolerance, decreased balance, decreased coordination, decreased endurance, decreased knowledge of use of DME, decreased mobility, difficulty walking, decreased ROM, decreased strength, hypomobility, increased edema, impaired perceived functional ability, impaired flexibility, and pain.   ACTIVITY LIMITATIONS: carrying, lifting, bending, sitting, standing, squatting, stairs, and locomotion level  PARTICIPATION LIMITATIONS: meal prep, cleaning, laundry, personal finances, driving, shopping, community activity, occupation, and yard work  PERSONAL FACTORS: Time since onset of injury/illness/exacerbation and 1-2 comorbidities: see above  are also affecting patient's functional outcome.   REHAB POTENTIAL: Good  CLINICAL DECISION MAKING: Stable/uncomplicated  EVALUATION COMPLEXITY: Low   GOALS: Goals reviewed with patient? Yes  SHORT TERM GOALS: (target date for Short term goals are 3 weeks 04/22/2023)   1.  Patient will demonstrate independent use of home exercise program to maintain progress from in clinic treatments.  Goal status: ongoing 04/08/2023  LONG TERM GOALS: (target dates for all long term goals are 10 weeks  06/10/2023 )   1. Patient will demonstrate/report pain at worst less than or equal to 2/10 to facilitate minimal limitation in daily activity secondary to pain symptoms.  Goal status: ongoing 05/12/2023   2. Patient will demonstrate independent use of home exercise program to facilitate ability to maintain/progress functional gains from skilled physical therapy services.  Goal status: ongoing 05/12/2023   3.  Patient will demonstrate Patient specific functional scale avg > or = 8 to indicate reduced disability due to condition.   Goal status: ongoing 05/12/2023   4.  Patient will demonstrate Lt LE MMT 5/5 throughout to faciltiate usual transfers, stairs, squatting at [facility] for daily life.   Goal status: met 05/12/2023   5.  Patient will demonstrate up and down a flight of stairs with single hand rail with reciprocal gait pattern Goal status: ongoing 05/12/2023   6.  Patient will demonstrate independent ambulation > 500 ft s deviation due to condition.   Goal status: ongoing 05/12/2023   7.  Patient will demonstrate AROM 0-115deg to facilitate usual mobility in daily activity at PLOF.  Goal Status: ongoing 05/12/2023   PLAN:  PT FREQUENCY: 2-3x/week (starting at 3x)  PT DURATION: 10 weeks  PLANNED INTERVENTIONS: Can include 16109- PT Re-evaluation, 97110-Therapeutic exercises, 97530- Therapeutic activity, 97112- Neuromuscular re-education, 97535- Self Care, 97140- Manual therapy, [phone]- Gait training, [phone]- Orthotic Fit/training, [phone]- Canalith repositioning, [id number]- Aquatic Therapy, [phone]- Electrical stimulation (unattended), [phone]- Electrical stimulation (manual), [id number] Physical performance testing, 97016- Vasopneumatic device, [id number]- Ultrasound, [id number]- Traction (mechanical), [id number]- Ionotophoresis 4mg /ml Dexamethasone, Patient/Family education, Balance training, Stair training, Taping, Dry Needling, Joint mobilization, Joint manipulation, Spinal manipulation, Spinal mobilization, Scar mobilization, Vestibular training, Visual/preceptual remediation/compensation, DME instructions, Cryotherapy, and Moist heat.  All performed as medically necessary.  All included unless contraindicated  PLAN FOR NEXT SESSION: Functional strengthening.    Bonna Bustard, PT, DPT, OCS, ATC 05/21/23  1:49 PM

## 2023-05-25 ENCOUNTER — Ambulatory Visit (INDEPENDENT_AMBULATORY_CARE_PROVIDER_SITE_OTHER): Admitting: Rehabilitative and Restorative Service Providers"

## 2023-05-25 ENCOUNTER — Encounter: Payer: Self-pay | Admitting: Rehabilitative and Restorative Service Providers"

## 2023-05-25 DIAGNOSIS — M6281 Muscle weakness (generalized): Secondary | ICD-10-CM

## 2023-05-25 DIAGNOSIS — M25662 Stiffness of left knee, not elsewhere classified: Secondary | ICD-10-CM

## 2023-05-25 DIAGNOSIS — G8929 Other chronic pain: Secondary | ICD-10-CM

## 2023-05-25 DIAGNOSIS — M25562 Pain in left knee: Secondary | ICD-10-CM | POA: Diagnosis not present

## 2023-05-25 DIAGNOSIS — R2689 Other abnormalities of gait and mobility: Secondary | ICD-10-CM | POA: Diagnosis not present

## 2023-05-25 DIAGNOSIS — R600 Localized edema: Secondary | ICD-10-CM

## 2023-05-25 NOTE — Therapy (Signed)
 OUTPATIENT PHYSICAL THERAPY TREATMENT    Patient Name: Genevieve Ritzel MRN: [id number] DOB:1960-11-28, 63 y.o., male Today's Date: 05/25/2023  END OF SESSION:  PT End of Session - 05/25/23 1132     Visit Number 18    Number of Visits 25    Date for PT Re-Evaluation 06/11/23    Authorization Type [employer] 20% coinsurance    Progress Note Due on Visit 20    PT Start Time 1132    PT Stop Time 1226    PT Time Calculation (min) 54 min    Activity Tolerance Patient tolerated treatment well    Behavior During Therapy WFL for tasks assessed/performed                      Past Medical History:  Diagnosis Date   Arthritis    History of kidney stones    Hypertension    Kidney stones    Pre-diabetes    Past Surgical History:  Procedure Laterality Date   EXTRACORPOREAL SHOCK WAVE LITHOTRIPSY Right 03/23/2017   Procedure: RIGHT EXTRACORPOREAL SHOCK WAVE LITHOTRIPSY (ESWL);  Surgeon: Trent Frizzle, MD;  Location: [facility];  Service: Urology;  Laterality: Right;   HERNIA REPAIR     done as child   LITHOTRIPSY     TOTAL HIP ARTHROPLASTY Left 08/24/2020   Procedure: LEFT TOTAL HIP ARTHROPLASTY ANTERIOR APPROACH;  Surgeon: Arnie Lao, MD;  Location: [facility];  Service: Orthopedics;  Laterality: Left;   TOTAL KNEE ARTHROPLASTY Left 03/13/2023   Procedure: LEFT TOTAL KNEE ARTHROPLASTY;  Surgeon: Arnie Lao, MD;  Location: [facility];  Service: Orthopedics;  Laterality: Left;   Patient Active Problem List   Diagnosis Date Noted   Status post total left knee replacement 03/13/2023   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  Didn't get much exercise in this morning due to another appointment.  Reported plan to return to work in about 2 weeks.   PERTINENT HISTORY: Lt THA (2022), HTN, arthritis   PAIN:  NPRS scale: no pain at rest.  Pain location: Lt knee Pain description:  Aggravating factors: bending the knee Relieving factors: icing, meds, rest  PRECAUTIONS: None  WEIGHT BEARING RESTRICTIONS: No  FALLS:  Has patient fallen in last 6 months? No  LIVING ENVIRONMENT: Lives with: lives with their family and lives with their spouse Lives in: House/apartment Stairs: Yes: External: 3 steps; none Has following equipment at home: Single point cane, Walker - 2 wheeled, Marine scientist  OCCUPATION: short term disability; does Biomedical engineer. Does need to be able to squat/bend down/ stand for long periods of time and will be wearing heavy steel toe boots. Needs to go back to work 12 wks from now (around 1st or 2nd week of May)  PLOF: Independent  PATIENT GOALS: "get back to normal and get back to work"   Next MD visit: 04/27/2023 Dr. Lucienne Ryder   OBJECTIVE:   DIAGNOSTIC FINDINGS:  FINDINGS: Interval total left knee arthroplasty.  No perihardware lucency is seen to indicate hardware failure or loosening. Expected postoperative changes including intra-articular and subcutaneous air. Smalljoint effusion. Anterior surgical skin staples. Mild chronic enthesopathic change at the quadriceps insertion on the patella. No acute fracture or dislocation.  PATIENT SURVEYS:   Patient-specific activity scoring scheme   "0" represents "unable to perform." "10" represents "able to perform at prior level. 0 1 2 3 4 5 6 7 8 9  10 (Date and Score) Activity Initial  Activity Eval 04/01/2023 05/12/2023  Bending/straightening knee  for don/doff shoes 0 8  2. Stairs in reciprocal pattern 0 8  3. Standing for long periods of time 3 8       1 8  avg   Total score = sum of the activity scores/number of activities Minimum detectable change (90%CI) for average score = 2 points Minimum detectable change (90%CI) for single activity score = 3 points   COGNITION: 04/01/2023 Overall cognitive status: WFL    SENSATION: 04/01/2023 [facility]  EDEMA:  04/01/2023 Circumferential: above knee: 45cm; below knee: 37.5cm  MUSCLE LENGTH: 04/01/2023 Not tested  POSTURE:  04/01/2023 No Significant postural limitations  PALPATION: 04/01/2023 Non-tender to calf/quad/hamstrings. Notable HS tightness.  LOWER EXTREMITY ROM:   ROM Left 04/01/2023 Left 04/02/2023 Left 04/06/23 Left 04/08/2023 Left 04/14/2023 Left 04/15/2023 Left 04/23/2023 Left 04/27/2023 Left 05/04/2023 Left 05/12/2023  Hip flexion            Hip extension            Hip abduction            Hip adduction            Hip internal rotation            Hip external rotation            Knee flexion A:72 P: not attempted due to patient pain and tightness Supine with strap AAROM:78 Supine A: 85 P: 90 Supine:  AAROM 85 Supine AROM heel slide 93  Supine AAROM with strap 90 Supine AROM heel slide 95 Supine AAROM with strap 100 deg.  Supine AROM heel slide: 104 Supine AROM heel slide c strap: 104   Knee extension A: 4 P: 0 A:2  A: 2 P: 0       0  Ankle dorsiflexion            Ankle plantarflexion            Ankle inversion            Ankle eversion             (Blank rows = not tested)  LOWER EXTREMITY MMT:  MMT Right 04/01/2023 Left 04/01/2023 Left 04/14/2023  Hip flexion 5 5   Hip extension     Hip abduction     Hip adduction     Hip internal rotation     Hip external rotation     Knee flexion 5 3-   Knee extension 5 3- 5/5 (in available range)  Ankle dorsiflexion 5 5   Ankle plantarflexion     Ankle inversion     Ankle eversion      (Blank rows = not  tested)  LOWER EXTREMITY SPECIAL TESTS:  04/01/2023 None tested  FUNCTIONAL TESTS:  05/25/2023: Reciprocal gait pattern up/down stairs without UE assist.   04/01/2023 Not tested   GAIT: 05/12/2023: Able to ambulate independently in clinic.   04/30/2023:  ambulates with SPC at times just carrying SPC in hand. Good  balance noted however flexion remains decreased. Encouraged to continue use with SPC until better gait mechanics and overall strength are achieved.  04/20/2023: Able to perform household distances in clinic without [facility] but SPC use recommended in community still to offload LLE  04/01/2023 Distance walked: clinic distance Assistive device utilized: Single point cane Level of assistance: Modified independence Comments: decreased knee extension, decreased stance time on LLE, decreased step length, foot flat due to decreased ability to extend knee- patient able to DF appropriately as seen in MMT                                                                                                                                                                         TREATMENT                                                                          DATE:  05/25/2023 Therex: UBE LE only for ROM, seat 11 10 mins lvl 3.5 Incline gastroc runner stretch Lt leg posterior 30 sec x 5  Knee extension machine double leg up single leg down Lt leg 15 lbs 2 x 15 slot 3 Long duration stretch on knee machine slot 2 from back 5 lbs 4 mins    Neuro Re-ed SLS on airex foam with contralateral corner touching x 8 each, performed bilaterally Forward/back rocker board light touching double leg x 30 each    TherActivity (to improve stairs, squatting, transfers) Leg press double leg 100 lbs x 15 in available flexion, Lt leg only 2 x 15 50 lbs  Flight of stairs reciprocal gait pattern up/down with supervision.  Minimal hand use on rail.    TREATMENT                                                                           DATE:  05/21/2023 Therex: UBE LE only for ROM, seat 11 10 mins lvl 3.5 Incline gastroc runner stretch Lt leg posterior 30 sec x 5  Knee extension machine double leg up single leg down Lt leg 15 lbs 2 x 15 slot 3 Long duration stretch on knee machine slot 2 from back 5 lbs 4 mins  Seated LAQ extension/flexion pause Lt knee x 15  Neuro Re-ed SLS on airex foam with contralateral corner touching x 8 each, performed bilaterally Lateral stepping 3 cones x 8 each, performed bilaterally    TherActivity (to improve stairs, squatting, transfers) Step on over and down WB on Lt leg 6 inch step x 15 no UE assist TRX with strap over chair 2 x 15  Vaso 10 mins Lt knee in elevation medium compression 34 degrees  TREATMENT                                                                          DATE:  05/19/2023 Therex: UBE LE only for ROM, seat 11 10 mins lvl 3.5 Incline gastroc runner stretch Lt leg posterior 30 sec x 5  Education on gym based return. Walking program ideas for work return.    TherActivity (to improve stairs, squatting, transfers) TRX over chair squats 2 x 15  Lateral step up/down 6 inch 2 x 15 c blue band tke in stance 2-3 sec - WB on Lt leg  Manual Lt knee flexion c mobilization c movement IR /distraction.   PATIENT EDUCATION:  04/01/2023 Education details: HEP, POC Person educated: Patient Education method: Explanation, Demonstration, Verbal cues, and Handouts Education comprehension: verbalized understanding, returned demonstration, and verbal cues required  HOME EXERCISE PROGRAM: Access Code: [email or web address] URL: [email or web address] Date: 04/01/2023 Prepared by: Bonna Bustard   Exercises - Supine Heel Slide with Strap  - 3-5 x daily - 7 x weekly - 1 sets - 10 reps - 5-10 hold - Seated Knee Flexion AAROM (Mirrored)  - 3-5 x daily - 7 x weekly - 1 sets - 5 reps - 10-15 hold - Quad Setting and Stretching  - 3-5 x daily - 7 x weekly - 1 sets -  10 reps - 5 hold - Seated Quad Set (Mirrored)  - 3-5 x daily - 7 x weekly - 1 sets - 10 reps - 5 hold - Seated Long Arc Quad (Mirrored)  - 3-5 x daily - 7 x weekly - 1 sets - 5-10 reps - 2 hold - Long Sitting Calf Stretch with Strap  - 2-3 x daily - 7 x weekly - 1 sets - 3-5 reps - 15-30 hold - Seated Gastroc Stretch with Strap (Mirrored)  - 2-3 x daily - 7 x weekly - 1 sets - 5 reps - 30 hold  ASSESSMENT:  CLINICAL IMPRESSION: Continued general discussion on importance of rest breaks, and mobility in day as able.  Discussed use of compression stocking for work return to help control edema response to standing prolonged.  Plan for measurement reassessment next visit.    OBJECTIVE IMPAIRMENTS: Abnormal gait, decreased activity tolerance, decreased balance, decreased coordination, decreased endurance, decreased knowledge of use of DME, decreased mobility, difficulty walking, decreased ROM, decreased strength, hypomobility, increased edema, impaired perceived functional ability, impaired flexibility, and pain.   ACTIVITY LIMITATIONS: carrying, lifting, bending, sitting, standing, squatting, stairs, and locomotion level  PARTICIPATION LIMITATIONS: meal prep, cleaning, laundry, personal finances, driving, shopping, community activity, occupation, and yard work  PERSONAL FACTORS: Time since onset of injury/illness/exacerbation and 1-2 comorbidities: see above  are also affecting patient's functional outcome.   REHAB POTENTIAL: Good  CLINICAL DECISION MAKING: Stable/uncomplicated  EVALUATION COMPLEXITY: Low   GOALS: Goals reviewed with patient? Yes  SHORT TERM GOALS: (target date for Short term goals are 3 weeks 04/22/2023)   1.  Patient will demonstrate independent use of home exercise program to maintain progress from in clinic treatments.  Goal status: Met  LONG TERM GOALS: (target dates for all long term goals are 10 weeks  06/10/2023 )   1. Patient will demonstrate/report pain at  worst less than or equal to 2/10 to facilitate minimal limitation in daily activity secondary to pain symptoms.  Goal status: ongoing 05/12/2023   2. Patient will demonstrate independent use of home exercise program to facilitate ability to maintain/progress functional gains from skilled physical therapy services.  Goal status: ongoing 05/12/2023   3. Patient will demonstrate Patient specific functional scale avg > or = 8 to indicate reduced disability due to condition.   Goal status: ongoing 05/12/2023   4.  Patient will demonstrate Lt LE MMT 5/5 throughout to faciltiate usual transfers, stairs, squatting at [facility] for daily life.   Goal status: met 05/12/2023   5.  Patient will demonstrate up and down a flight of stairs with single hand rail with reciprocal gait pattern Goal status: ongoing 05/12/2023   6.  Patient will demonstrate independent ambulation > 500 ft s deviation due to condition.   Goal status: ongoing 05/12/2023   7.  Patient will demonstrate AROM 0-115deg to facilitate usual mobility in daily activity at PLOF.  Goal Status: ongoing 05/12/2023   PLAN:  PT FREQUENCY: 2-3x/week (starting at 3x)  PT DURATION: 10 weeks  PLANNED INTERVENTIONS: Can include 78295- PT Re-evaluation, 97110-Therapeutic exercises, 97530- Therapeutic activity, 97112- Neuromuscular re-education, 97535- Self Care, 97140- Manual therapy, [phone]- Gait training, [phone]- Orthotic Fit/training, [phone]- Canalith repositioning, [id number]- Aquatic Therapy, [phone]- Electrical stimulation (unattended), [id number]- Electrical stimulation (manual), [id number] Physical performance testing, 97016- Vasopneumatic device, [id number]- Ultrasound, [id number]- Traction (mechanical), [id number]- Ionotophoresis 4mg /ml Dexamethasone , Patient/Family education, Balance training, Stair training, Taping, Dry Needling, Joint mobilization, Joint manipulation, Spinal manipulation, Spinal mobilization, Scar mobilization, Vestibular training, Visual/preceptual  remediation/compensation, DME instructions, Cryotherapy, and Moist heat.  All performed as medically necessary.  All included unless contraindicated  PLAN FOR NEXT SESSION: Measurements, possible HEP transitioning.    Bonna Bustard, PT, DPT, OCS, ATC 05/25/23  12:28 PM

## 2023-05-29 ENCOUNTER — Encounter: Payer: Self-pay | Admitting: Rehabilitative and Restorative Service Providers"

## 2023-05-29 ENCOUNTER — Ambulatory Visit (INDEPENDENT_AMBULATORY_CARE_PROVIDER_SITE_OTHER): Admitting: Rehabilitative and Restorative Service Providers"

## 2023-05-29 DIAGNOSIS — M25562 Pain in left knee: Secondary | ICD-10-CM

## 2023-05-29 DIAGNOSIS — R600 Localized edema: Secondary | ICD-10-CM

## 2023-05-29 DIAGNOSIS — M6281 Muscle weakness (generalized): Secondary | ICD-10-CM

## 2023-05-29 DIAGNOSIS — G8929 Other chronic pain: Secondary | ICD-10-CM

## 2023-05-29 DIAGNOSIS — M25662 Stiffness of left knee, not elsewhere classified: Secondary | ICD-10-CM | POA: Diagnosis not present

## 2023-05-29 DIAGNOSIS — R2689 Other abnormalities of gait and mobility: Secondary | ICD-10-CM

## 2023-05-29 NOTE — Therapy (Addendum)
 OUTPATIENT PHYSICAL THERAPY TREATMENT / DISCHARGE   Patient Name: Gregory Howard MRN: [id number] DOB:21-Mar-1960, 63 y.o., male Today's Date: 05/29/2023  END OF SESSION:  PT End of Session - 05/29/23 1049     Visit Number 19    Number of Visits 25    Date for PT Re-Evaluation 06/11/23    Authorization Type [employer] 20% coinsurance    Progress Note Due on Visit 20    PT Start Time 1053    PT Stop Time 1132    PT Time Calculation (min) 39 min    Activity Tolerance Patient tolerated treatment well    Behavior During Therapy WFL for tasks assessed/performed                       Past Medical History:  Diagnosis Date   Arthritis    History of kidney stones    Hypertension    Kidney stones    Pre-diabetes    Past Surgical History:  Procedure Laterality Date   EXTRACORPOREAL SHOCK WAVE LITHOTRIPSY Right 03/23/2017   Procedure: RIGHT EXTRACORPOREAL SHOCK WAVE LITHOTRIPSY (ESWL);  Surgeon: Matilda Senior, MD;  Location: [facility];  Service: Urology;  Laterality: Right;   HERNIA REPAIR     done as child   LITHOTRIPSY     TOTAL HIP ARTHROPLASTY Left 08/24/2020   Procedure: LEFT TOTAL HIP ARTHROPLASTY ANTERIOR APPROACH;  Surgeon: Vernetta Lonni GRADE, MD;  Location: [facility];  Service: Orthopedics;  Laterality: Left;   TOTAL KNEE ARTHROPLASTY Left 03/13/2023   Procedure: LEFT TOTAL KNEE ARTHROPLASTY;  Surgeon: Vernetta Lonni GRADE, MD;  Location: [facility];  Service: Orthopedics;  Laterality: Left;   Patient Active Problem List   Diagnosis Date Noted   Status post total left knee replacement 03/13/2023   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   PERTINENT HISTORY: Lt THA (2022), HTN, arthritis   PAIN:  NPRS scale: no pain at rest.  Pain location: Lt knee Pain description:  Aggravating factors: bending the knee Relieving factors: icing, meds, rest  PRECAUTIONS: None  WEIGHT BEARING RESTRICTIONS: No  FALLS:  Has patient fallen in last 6 months? No  LIVING ENVIRONMENT: Lives with: lives with their family and lives with their spouse Lives in: House/apartment Stairs: Yes: External: 3 steps; none Has following equipment at home: Single point cane, Walker - 2 wheeled, Marine scientist  OCCUPATION: short term disability; does Biomedical engineer. Does need to be able to squat/bend down/ stand for long periods of time and will be wearing heavy steel toe boots. Needs to go back to work 12 wks from now (around 1st or 2nd week of May)  PLOF: Independent  PATIENT GOALS: get back to normal and get back to work   Next MD visit: 04/27/2023 Dr. Vernetta   OBJECTIVE:   DIAGNOSTIC FINDINGS:  FINDINGS: Interval total left knee arthroplasty. No perihardware lucency  is seen to indicate hardware failure or loosening. Expected postoperative changes including intra-articular and subcutaneous air. Smalljoint effusion. Anterior surgical skin staples. Mild chronic enthesopathic change at the quadriceps insertion on the patella. No acute fracture or dislocation.  PATIENT SURVEYS:   Patient-specific activity scoring scheme   0 represents "unable to perform." 10 represents "able to perform at prior level. 0 1 2 3 4 5 6 7 8 9  10 (Date and Score) Activity Initial  Activity Eval 04/01/2023 05/12/2023 4/225/2025  Bending/straightening knee  for don/doff shoes 0 8 9  2. Stairs in reciprocal pattern 0 8 9  3. Standing for long periods of time 3 8 10        1 8  avg 9.33 avg   Total score = sum of the activity scores/number of activities Minimum detectable change (90%CI) for average score = 2 points Minimum detectable change (90%CI) for single activity score = 3 points   COGNITION: 04/01/2023 Overall cognitive status: WFL    SENSATION: 04/01/2023 [facility]  EDEMA:  04/01/2023 Circumferential: above knee: 45cm; below knee: 37.5cm  MUSCLE LENGTH: 04/01/2023 Not tested  POSTURE:  04/01/2023 No Significant postural limitations  PALPATION: 04/01/2023 Non-tender to calf/quad/hamstrings. Notable HS tightness.  LOWER EXTREMITY ROM:   ROM Left 04/01/2023 Left 04/02/2023 Left 04/06/23 Left 04/08/2023 Left 04/14/2023 Left 04/15/2023 Left 04/23/2023 Left 04/27/2023 Left 05/04/2023 Left 05/12/2023 Left 05/29/2023  Hip flexion             Hip extension             Hip abduction             Hip adduction             Hip internal rotation             Hip external rotation             Knee flexion A:72 P: not attempted due to patient pain and tightness Supine with strap AAROM:78 Supine A: 85 P: 90 Supine:  AAROM 85 Supine AROM heel slide 93  Supine AAROM with strap 90 Supine AROM heel slide 95 Supine AAROM with strap 100 deg.  Supine AROM heel slide: 104 Supine AROM heel slide c strap: 104  Supine AROM heel slide 107   Knee extension A: 4 P: 0 A:2  A: 2 P: 0       0   Ankle dorsiflexion             Ankle plantarflexion             Ankle inversion             Ankle eversion              (Blank rows = not tested)  LOWER EXTREMITY MMT:  MMT Right 04/01/2023 Left 04/01/2023 Left 04/14/2023  Hip flexion 5 5   Hip extension     Hip abduction     Hip adduction     Hip internal rotation     Hip external rotation     Knee flexion 5 3-   Knee extension 5 3- 5/5 (in available range)  Ankle dorsiflexion 5 5   Ankle  plantarflexion     Ankle inversion     Ankle eversion      (Blank rows = not tested)  LOWER EXTREMITY SPECIAL TESTS:  04/01/2023 None tested  FUNCTIONAL TESTS:  05/25/2023: Reciprocal gait pattern up/down stairs without UE assist.   04/01/2023 Not tested  GAIT: 05/12/2023: Able to ambulate independently in clinic.   04/30/2023:  ambulates with SPC at times just carrying SPC in hand. Good balance noted however flexion remains decreased. Encouraged to continue use with SPC until better gait mechanics and overall strength are achieved.  04/20/2023: Able to perform household distances in clinic without [facility] but SPC use recommended in community still to offload LLE  04/01/2023 Distance walked: clinic distance Assistive device utilized: Single point cane Level of assistance: Modified independence Comments: decreased knee extension, decreased stance time on LLE, decreased step length, foot flat due to decreased ability to extend knee- patient able to DF appropriately as seen in MMT                                                                                                                                                                         TREATMENT                                                                          DATE:  05/29/2023 Therex: UBE LE only for ROM, seat 11 12 mins lvl 3.5 Incline gastroc runner stretch Lt leg posterior 30 sec x 5  Knee extension machine double leg up single leg down Lt leg 15 lbs 3 x 10  slot 3 Long duration stretch on knee machine slot 2 from back 5 lbs 4 mins  Supine heel slide 5 sec hold AROM x 10 Lt knee   Review of HEP, edema control strategies.   TherActivity (to improve stairs, squatting, transfers) Leg press double leg 106 lbs x 20 in available flexion, Lt leg only 2 x 15 50 lbs      TREATMENT                                                                          DATE:  05/25/2023 Therex: UBE LE only for ROM, seat 11 10 mins lvl  3.5 Incline gastroc runner stretch Lt leg posterior 30 sec x 5  Knee extension machine double leg up single leg down Lt leg 15 lbs 2 x 15 slot 3 Long duration stretch on knee machine slot 2 from back 5 lbs 4 mins  Neuro Re-ed SLS on airex foam with contralateral corner touching x 8 each, performed bilaterally Forward/back rocker board light touching double leg x 30 each    TherActivity (to improve stairs, squatting, transfers) Leg press double leg 100 lbs x 15 in available flexion, Lt leg only 2 x 15 50 lbs  Flight of stairs reciprocal gait pattern up/down with supervision.  Minimal hand use on rail.    TREATMENT                                                                          DATE:  05/21/2023 Therex: UBE LE only for ROM, seat 11 10 mins lvl 3.5 Incline gastroc runner stretch Lt leg posterior 30 sec x 5  Knee extension machine double leg up single leg down Lt leg 15 lbs 2 x 15 slot 3 Long duration stretch on knee machine slot 2 from back 5 lbs 4 mins  Seated LAQ extension/flexion pause Lt knee x 15  Neuro Re-ed SLS on airex foam with contralateral corner touching x 8 each, performed bilaterally Lateral stepping 3 cones x 8 each, performed bilaterally    TherActivity (to improve stairs, squatting, transfers) Step on over and down WB on Lt leg 6 inch step x 15 no UE assist TRX with strap over chair 2 x 15  Vaso 10 mins Lt knee in elevation medium compression 34 degrees  TREATMENT                                                                          DATE:  05/19/2023 Therex: UBE LE only for ROM, seat 11 10 mins lvl 3.5 Incline gastroc runner stretch Lt leg posterior 30 sec x 5  Education on gym based return. Walking program ideas for work return.    TherActivity (to improve stairs, squatting, transfers) TRX over chair squats 2 x 15  Lateral step up/down 6 inch 2 x 15 c blue band tke in stance 2-3 sec - WB on Lt leg  Manual Lt knee flexion c mobilization c  movement IR /distraction.   PATIENT EDUCATION:  04/01/2023 Education details: HEP, POC Person educated: Patient Education method: Explanation, Demonstration, Verbal cues, and Handouts Education comprehension: verbalized understanding, returned demonstration, and verbal cues required  HOME EXERCISE PROGRAM: Access Code: [email or web address] URL: [email or web address] Date: 04/01/2023 Prepared by: Ozell Silvan   Exercises - Supine Heel Slide with Strap  - 3-5 x daily - 7 x weekly - 1 sets - 10 reps - 5-10 hold - Seated Knee Flexion AAROM (Mirrored)  - 3-5 x daily - 7 x weekly - 1 sets - 5 reps - 10-15 hold - Quad Setting and Stretching  - 3-5 x daily - 7 x weekly - 1 sets - 10 reps - 5 hold - Seated Quad Set (Mirrored)  - 3-5 x daily - 7 x weekly - 1 sets - 10 reps - 5 hold - Seated Long Arc Quad (Mirrored)  - 3-5  x daily - 7 x weekly - 1 sets - 5-10 reps - 2 hold - Long Sitting Calf Stretch with Strap  - 2-3 x daily - 7 x weekly - 1 sets - 3-5 reps - 15-30 hold - Seated Gastroc Stretch with Strap (Mirrored)  - 2-3 x daily - 7 x weekly - 1 sets - 5 reps - 30 hold  ASSESSMENT:  CLINICAL IMPRESSION: At this time, agreement was to transition to HEP at this time due to improvements, confident in HEP and plan for return to work.     OBJECTIVE IMPAIRMENTS: Abnormal gait, decreased activity tolerance, decreased balance, decreased coordination, decreased endurance, decreased knowledge of use of DME, decreased mobility, difficulty walking, decreased ROM, decreased strength, hypomobility, increased edema, impaired perceived functional ability, impaired flexibility, and pain.   ACTIVITY LIMITATIONS: carrying, lifting, bending, sitting, standing, squatting, stairs, and locomotion level  PARTICIPATION LIMITATIONS: meal prep, cleaning, laundry, personal finances, driving, shopping, community activity, occupation, and yard work  PERSONAL FACTORS: Time since onset of injury/illness/exacerbation  and 1-2 comorbidities: see above are also affecting patient's functional outcome.   REHAB POTENTIAL: Good  CLINICAL DECISION MAKING: Stable/uncomplicated  EVALUATION COMPLEXITY: Low   GOALS: Goals reviewed with patient? Yes  SHORT TERM GOALS: (target date for Short term goals are 3 weeks 04/22/2023)   1.  Patient will demonstrate independent use of home exercise program to maintain progress from in clinic treatments.  Goal status: Met  LONG TERM GOALS: (target dates for all long term goals are 10 weeks  06/10/2023 )   1. Patient will demonstrate/report pain at worst less than or equal to 2/10 to facilitate minimal limitation in daily activity secondary to pain symptoms.  Goal status: ongoing 05/12/2023   2. Patient will demonstrate independent use of home exercise program to facilitate ability to maintain/progress functional gains from skilled physical therapy services.  Goal status: ongoing 05/12/2023   3. Patient will demonstrate Patient specific functional scale avg > or = 8 to indicate reduced disability due to condition.   Goal status: ongoing 05/12/2023   4.  Patient will demonstrate Lt LE MMT 5/5 throughout to faciltiate usual transfers, stairs, squatting at [facility] for daily life.   Goal status: met 05/12/2023   5.  Patient will demonstrate up and down a flight of stairs with single hand rail with reciprocal gait pattern Goal status: ongoing 05/12/2023   6.  Patient will demonstrate independent ambulation > 500 ft s deviation due to condition.   Goal status: Gait    7.  Patient will demonstrate AROM 0-115deg to facilitate usual mobility in daily activity at PLOF.  Goal Status: partially 05/29/2023   PLAN:  PT FREQUENCY: 2-3x/week (starting at 3x)  PT DURATION: 10 weeks  PLANNED INTERVENTIONS: Can include 02853- PT Re-evaluation, 97110-Therapeutic exercises, 97530- Therapeutic activity, 97112- Neuromuscular re-education, 97535- Self Care, 97140- Manual therapy, [phone]- Gait  training, [phone]- Orthotic Fit/training, [phone]- Canalith repositioning, [id number]- Aquatic Therapy, [phone]- Electrical stimulation (unattended), [phone]- Electrical stimulation (manual), [id number] Physical performance testing, 97016- Vasopneumatic device, [id number]- Ultrasound, [id number]- Traction (mechanical), [id number]- Ionotophoresis 4mg /ml Dexamethasone , Patient/Family education, Balance training, Stair training, Taping, Dry Needling, Joint mobilization, Joint manipulation, Spinal manipulation, Spinal mobilization, Scar mobilization, Vestibular training, Visual/preceptual remediation/compensation, DME instructions, Cryotherapy, and Moist heat.  All performed as medically necessary.  All included unless contraindicated  PLAN FOR NEXT SESSION: HEP transitioning.    Ozell Silvan, PT, DPT, OCS, ATC 05/29/23  11:31 AM  PHYSICAL THERAPY DISCHARGE SUMMARY  Visits from Start of Care: 19  Current functional level related to goals / functional outcomes: See note   Remaining deficits: See note   Education / Equipment: HEP  Patient goals were mostly met. Patient is being discharged due to being pleased with the current functional level./ not returning  Ozell Silvan, PT, DPT, OCS, ATC 09/18/23  10:28 AM

## 2023-06-02 ENCOUNTER — Encounter: Admitting: Rehabilitative and Restorative Service Providers"

## 2023-08-17 ENCOUNTER — Ambulatory Visit (INDEPENDENT_AMBULATORY_CARE_PROVIDER_SITE_OTHER): Admitting: Orthopaedic Surgery

## 2023-08-17 ENCOUNTER — Encounter: Payer: Self-pay | Admitting: Orthopaedic Surgery

## 2023-08-17 ENCOUNTER — Other Ambulatory Visit (INDEPENDENT_AMBULATORY_CARE_PROVIDER_SITE_OTHER): Payer: Self-pay

## 2023-08-17 DIAGNOSIS — Z96652 Presence of left artificial knee joint: Secondary | ICD-10-CM

## 2023-08-17 NOTE — Progress Notes (Signed)
 The patient is here today at 5 months status post a left total knee replacement.  He says he is doing well and has good range of motion and strength.  We have replaced his left hip and he thinks that the left hip was a harder recovery than the left knee.  On exam there is still moderate swelling with the left knee.  His extension is full and I can flex in the past 95 degrees.  2 views the left knee show well-seated left total knee arthroplasty with no complicating features.  Hopefully the swelling will decrease with time and that is what we usually see.  It is more prominent in him since he is a thin individual.  He will continue his home exercise program and routines for his knee on a regular basis.  From our standpoint we will see him back in 6 months unless there are issues.  Will have a final standing AP and lateral of his left knee at that visit.  Obviously if symptoms going on before then he knows to reach out and let us  know.

## 2023-12-07 ENCOUNTER — Encounter: Payer: Self-pay | Admitting: Radiology

## 2024-02-17 ENCOUNTER — Other Ambulatory Visit (INDEPENDENT_AMBULATORY_CARE_PROVIDER_SITE_OTHER)

## 2024-02-17 ENCOUNTER — Encounter: Payer: Self-pay | Admitting: Orthopaedic Surgery

## 2024-02-17 ENCOUNTER — Ambulatory Visit: Admitting: Orthopaedic Surgery

## 2024-02-17 DIAGNOSIS — M5442 Lumbago with sciatica, left side: Secondary | ICD-10-CM

## 2024-02-17 DIAGNOSIS — Z96652 Presence of left artificial knee joint: Secondary | ICD-10-CM

## 2024-02-17 MED ORDER — METHYLPREDNISOLONE 4 MG PO TABS: ORAL_TABLET | ORAL | 0 refills | Status: AC

## 2024-02-17 MED ORDER — GABAPENTIN 300 MG PO CAPS: 300.0000 mg | ORAL_CAPSULE | Freq: Every day | ORAL | 0 refills | Status: AC

## 2024-02-17 MED ORDER — TIZANIDINE HCL 4 MG PO TABS: 4.0000 mg | ORAL_TABLET | Freq: Three times a day (TID) | ORAL | 0 refills | Status: AC | PRN

## 2024-02-17 NOTE — Progress Notes (Signed)
 The patient is a 64 year old well-known to us .  He is getting close to a year out from a left total knee replacement.  We have replaced his left hip in the past.  He comes in for follow-up for his knee.  He does let us  know though that he has been having some low back pain and some left hip pain.  He has seen his primary care physician for this.  He has tried anti-inflammatories.  He actually looks uncomfortable when he walks which I think is related more to his back.  His left hip moves smoothly and fluidly and there is no blocks to rotation of the hip.  There is no significant pain when I fully flex his hip.  His left knee has full extension but I can only flex him to just past 90 degrees.  He does have a positive straight leg raise on the left side and looks very uncomfortable in terms of his spine.  2 views of his left knee show well-seated cemented total knee arthroplasty with no complicating features.  I would like to start a combination of medications to see if this will help his back.  We will put him on a steroid taper combined with some tizanidine  and gabapentin  at bedtime.  We will then see him back in just 2 weeks.  At that visit I would like a standing AP and lateral of his lumbar spine if he is still symptomatic with his back and radicular symptoms.  We would then go from there in terms of potentially MRI if his symptoms are not improving.  If he worsens before then he will let us  know.

## 2024-03-14 ENCOUNTER — Ambulatory Visit: Admitting: Orthopaedic Surgery
